# Patient Record
Sex: Female | Born: 1980 | Hispanic: Yes | Marital: Single | Smoking: Never smoker
Health system: Southern US, Community
[De-identification: ages and names within clinical notes are randomized; demographics above are authoritative.]

## PROBLEM LIST (undated history)

## (undated) DIAGNOSIS — N200 Calculus of kidney: Secondary | ICD-10-CM

## (undated) DIAGNOSIS — I10 Essential (primary) hypertension: Secondary | ICD-10-CM

## (undated) DIAGNOSIS — Z87442 Personal history of urinary calculi: Secondary | ICD-10-CM

## (undated) HISTORY — DX: Essential (primary) hypertension: I10

## (undated) HISTORY — DX: Calculus of kidney: N20.0

---

## 2000-04-08 HISTORY — PX: KIDNEY SURGERY: SHX687

## 2003-04-09 HISTORY — PX: BREAST SURGERY: SHX581

## 2003-04-09 HISTORY — PX: AUGMENTATION MAMMAPLASTY: SUR837

## 2005-03-03 ENCOUNTER — Emergency Department: Payer: Self-pay | Admitting: Unknown Physician Specialty

## 2009-01-28 ENCOUNTER — Emergency Department: Payer: Self-pay | Admitting: Internal Medicine

## 2010-05-02 ENCOUNTER — Ambulatory Visit: Payer: Self-pay | Admitting: Family Medicine

## 2012-01-22 ENCOUNTER — Ambulatory Visit: Payer: Self-pay

## 2012-01-23 ENCOUNTER — Ambulatory Visit: Payer: Self-pay

## 2012-02-07 HISTORY — PX: BREAST SURGERY: SHX581

## 2012-04-08 HISTORY — PX: BREAST CYST ASPIRATION: SHX578

## 2012-09-07 ENCOUNTER — Ambulatory Visit: Payer: Self-pay | Admitting: General Surgery

## 2012-09-29 HISTORY — PX: BREAST SURGERY: SHX581

## 2012-09-29 HISTORY — PX: BREAST BIOPSY: SHX20

## 2012-10-01 ENCOUNTER — Encounter: Payer: Self-pay | Admitting: General Surgery

## 2012-10-01 ENCOUNTER — Ambulatory Visit: Payer: Self-pay

## 2012-10-01 ENCOUNTER — Ambulatory Visit (INDEPENDENT_AMBULATORY_CARE_PROVIDER_SITE_OTHER): Payer: PRIVATE HEALTH INSURANCE | Admitting: General Surgery

## 2012-10-01 VITALS — BP 120/72 | HR 74 | Resp 12 | Ht <= 58 in | Wt 126.0 lb

## 2012-10-01 DIAGNOSIS — N63 Unspecified lump in unspecified breast: Secondary | ICD-10-CM

## 2012-10-01 DIAGNOSIS — N6002 Solitary cyst of left breast: Secondary | ICD-10-CM

## 2012-10-01 DIAGNOSIS — N6009 Solitary cyst of unspecified breast: Secondary | ICD-10-CM

## 2012-10-01 NOTE — Patient Instructions (Addendum)
.  asaBiopsia de mama - Cuidados posteriores  (Breast Biopsy, Care After)  Estas indicaciones le proporcionan informacin general acerca de cmo deber cuidarse despus del procedimiento. El mdico tambin podr darle instrucciones especficas. Comunquese con el mdico si tiene algn problema o tiene preguntas despus del procedimiento. CUIDADOS EN EL HOGAR   Slo tome los medicamentos segn le indique el mdico.  No tome aspirina.  Mantenga las suturas (puntos) secos cuando se bae.  Proteja la zona de la biopsia. No deje que la zona se inflame.  Evite las actividades que podran tironear y abrir el sitio de la biopsia hasta que su mdico la autorice. Aqu se incluye:  Elongar.  Estirarse.  La prctica de ejercicios.  Deportes.  Levantar objetos que pesen ms de 3 lb. (1,300 kg.)  Siga su dieta habitual.  Use un buen sostn de soporte durante el tiempo que le indique su mdico.  Cambie los apsitosvendajes) tal como se le indic.  No beba alcohol si toma analgsicos.  Cumpla con los controles mdicos segn las indicaciones. Consulte la fecha en que los resultados estarn disponibles. Asegrese de [employer]. SOLICITE AYUDA DE INMEDIATO SI:   Tiene fiebre.  Aumenta el sangrado (ms de una pequea mancha) en el lugar de la biopsia.  Tiene dificultad para respirar.  Observa una secrecin de color blanco amarillento (pus) que proviene del sitio de la biopsia.  Presenta enrojecimiento, inflamacin (hinchazn),o aumento del dolor en el sitio de la biopsia.  Siente mal olor en el sitio de la biopsia.  La biopsia se abre despus de que le han retirado los [employer] grapas o la [place].  Tiene una erupcin.  Necesita medicamentos ms fuertes. ASEGRESE DE QUE:   Comprende estas instrucciones.  Controlar su enfermedad.  Solicitar ayuda de inmediato si no mejora o si empeora. Document Released: 09/24/2011 [facility] Patient Information 2014  [place], [place].

## 2012-10-01 NOTE — Progress Notes (Signed)
Patient ID: Elby Showers, female   DOB: [birth year], 32 y.o.   MRN: [id number]  Chief Complaint  Patient presents with  . Breast Problem    HPI [facility] Lynann Bologna is a 32 y.o. female.  Who presents for a breast evaluation referred by [facility]. The most recent mammogram was done on 01-23-12 .  Patient does perform regular self breast checks. No family history of breast cancer. She was here in Nov 2013 for similar issue.  Pain in left breast prior to her periods. Denies any other breast issues.  When she was here in Nov 2013 she states the area in the left breast was aspirated and her pain went away. The pain in left breast returned about 3 months ago. The pain is not as severe as it had been prior to her original assessment. 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 Jacgui from [facility] patient services attending the patient for interpretation.   HPI  History reviewed. No pertinent past medical history.  Past Surgical History  Procedure Laterality Date  . Breast surgery Left Nov 2013    aspiration  . Kidney surgery Left 2002    stone  . Breast surgery Left 2005    implant in [place]    No family history on file.  Social History History  Substance Use Topics  . Smoking status: Never Smoker   . Smokeless tobacco: Not on file  . Alcohol Use: No    No Known Allergies  No current outpatient prescriptions on file.   No current facility-administered medications for this visit.    Review of Systems Review of Systems  Blood pressure 120/72, pulse 74, resp. rate 12, height 4\' 9"  (1.448 m), weight 126 lb (57.153 kg), last menstrual period 09/26/2012.  Physical Exam Physical Exam  Constitutional: She is oriented to person, place, and time. She appears well-developed and well-nourished.  Cardiovascular: Normal rate and regular rhythm.   Pulmonary/Chest: Effort normal and breath sounds normal. Right breast exhibits no  inverted nipple, no mass, no nipple discharge, no skin change and no tenderness. Left breast exhibits tenderness. Left breast exhibits no inverted nipple, no mass, no nipple discharge and no skin change.  Lymphadenopathy:    She has no cervical adenopathy.    She has no axillary adenopathy.  Neurological: She is alert and oriented to person, place, and time.  Skin: Skin is warm and dry.  Tenderness left breast axillary tail, slight fullness. Implant in left breast present an unremarkable.   Data Reviewed At the time of her November 2013 exam a multilobulated cyst measuring up to 1.3 cm in diameter was identified in the 1:00 position 7 cm from the nipple.   Ultrasound exam completed today at the 1:00 position, 8 cm from the nipple a multilobulated cystic lesion measuring under 1 cm maximum diameter in all areas was identified. As she had developed recurrent discomfort with cyst recurrence, she was offered the opportunity for either 1) observation versus 2) simple cyst aspiration versus 3) back and biopsy to attempt removal the cyst wall. After consideration, she chose option #3.  Assessment    Recurrent breast cyst, left breast     Plan    The patient underwent a vacuum-assisted biopsy of the left breast. Total 10 cc of 0.5% Xylocaine with 0.25% Marcaine with 1: 200,000 units of epinephrine was utilized a well-tolerated. The area was prepped with ChloraPrep and draped. A 14-gauge Finesse device was passed through the  area under ultrasound guidance. A proximally 8 samples were obtained from multiple locations to completely obliterate the area. The procedure was well-tolerated. A postbiopsy clip was placed. Skin defect was closed with benzoin and Steri-Strips followed by Telfa and Tegaderm dressing. Instructions were provided. The patient will be contacted through the [facility] patient services office when results are available. The patient is aware because of the late hour, this will likely be on October 05, 2012.       Earline Mayotte 10/01/2012, 8:45 PM

## 2012-10-05 ENCOUNTER — Telehealth: Payer: Self-pay | Admitting: *Deleted

## 2012-10-05 LAB — PATHOLOGY

## 2012-10-05 NOTE — Telephone Encounter (Addendum)
The patient was noticed of their results and will follow up as schedule.

## 2012-10-05 NOTE — Telephone Encounter (Signed)
Message copied by Levada Schilling on Mon Oct 05, 2012 12:58 PM ------      Message from: [place], [place]      Created: Mon Oct 05, 2012 12:36 PM       Contact patient via interpreter service to notify her breast biopsy completed last Thursday was fine. No cancer. Nurse follow up as scheduled.  ------

## 2012-10-08 ENCOUNTER — Ambulatory Visit (INDEPENDENT_AMBULATORY_CARE_PROVIDER_SITE_OTHER): Payer: PRIVATE HEALTH INSURANCE | Admitting: *Deleted

## 2012-10-08 DIAGNOSIS — N63 Unspecified lump in unspecified breast: Secondary | ICD-10-CM

## 2012-10-08 NOTE — Progress Notes (Signed)
Patient here today for follow up post left  breast biopsy. Minimal bruising noted.  The patient is aware that a heating pad may be used for comfort as needed.  Aware of pathology. Follow up as scheduled. 

## 2014-02-07 ENCOUNTER — Encounter: Payer: Self-pay | Admitting: General Surgery

## 2014-02-28 ENCOUNTER — Ambulatory Visit: Payer: Self-pay

## 2014-03-10 ENCOUNTER — Ambulatory Visit (INDEPENDENT_AMBULATORY_CARE_PROVIDER_SITE_OTHER): Payer: PRIVATE HEALTH INSURANCE | Admitting: General Surgery

## 2014-03-10 ENCOUNTER — Encounter: Payer: Self-pay | Admitting: General Surgery

## 2014-03-10 VITALS — BP 120/82 | HR 76 | Resp 12 | Ht 61.0 in | Wt 117.0 lb

## 2014-03-10 DIAGNOSIS — N63 Unspecified lump in unspecified breast: Secondary | ICD-10-CM

## 2014-03-10 DIAGNOSIS — N644 Mastodynia: Secondary | ICD-10-CM

## 2014-03-10 NOTE — Progress Notes (Signed)
Patient ID: Wendy Ellis, female   DOB: January 01, 1981, 33 y.o.   MRN: [id number]  Chief Complaint  Patient presents with  . Other    left breast mass    HPI Wendy Ellis is a 33 y.o. female who presents for a breast evaluation. The most recent mammogram was done on 02/28/14 and a left breast ultrasound done on 03/01/14. Patient does perform self breast checks approximately every 2 months. She noticed left breast pain and mass approximately 1 month ago. She states the mass has stayed the same size but is painful. The pain is constant. Sometime the pain is more intense than other times. The mass is described as the size of a fingernail. This is not varied drain her recent menstrual cycle.  Previous biopsy of a recurring cyst in the breast was benign.   HPI  No past medical history on file.  Past Surgical History  Procedure Laterality Date  . Kidney surgery Left 2002    stone  . Breast surgery Left Nov 2013    aspiration  . Breast surgery Left 2005    implant in [place]  . Breast surgery Left 09/29/2012    Vacuum biopsy of the recurrent left breast lesion showing an apical cyst without malignancy.    No family history on file.  Social History History  Substance Use Topics  . Smoking status: Never Smoker   . Smokeless tobacco: Not on file  . Alcohol Use: No    No Known Allergies  No current outpatient prescriptions on file.   No current facility-administered medications for this visit.    Review of Systems Review of Systems  Constitutional: Negative.   Respiratory: Negative.   Cardiovascular: Negative.     Blood pressure 120/82, pulse 76, resp. rate 12, height 5\' 1"  (1.549 m), weight 117 lb (53.071 kg), last menstrual period 02/15/2014.  Physical Exam Physical Exam  Constitutional: She is oriented to person, place, and time. She appears well-developed and well-nourished.  Neck: Neck supple. No thyromegaly present.  Cardiovascular: Normal rate,  regular rhythm and normal heart sounds.   No murmur heard. Pulmonary/Chest: Effort normal and breath sounds normal. Right breast exhibits no inverted nipple, no mass, no nipple discharge, no skin change and no tenderness. Left breast exhibits mass and tenderness. Left breast exhibits no inverted nipple, no nipple discharge and no skin change.    Well healed scar at the bottom of left breast from implant.   1:30 axillary tail thickening in left breast.   Lymphadenopathy:    She has no cervical adenopathy.  Neurological: She is alert and oriented to person, place, and time.  Skin: Skin is warm and dry.    Data Reviewed Left breast mammogram and ultrasound dated 02/28/2014 showed no areas of concern for malignancy.  Retroglandular silicon implant noted. 6 mm oval nodules were noted to be cystic on ultrasound. ((Complicated).  Assessment    Benign breast exam. Focal mastalgia in the left breast axillary tail.    Plan    The patient was reassured that there is no indication for operative intervention at this time. No palpable masses clearly identified on clinical exam, mammography or ultrasound and x-ray tail. The small 6 mm cyst in the breasts are asymptomatic.  The patient should continue monthly self examination. She was encouraged to report any other changes in her breast for reassessment.    PCP:  Wendy Ellis  Ref MD: Wendy Ellis Interpreter: Wendy Ellis, Wendy Ellis 03/11/2014, 7:51 PM

## 2014-03-10 NOTE — Patient Instructions (Signed)
Patient may take tylenol, advil, or aleve as needed for pain or discomfort. Patient to return as needed. The patient is aware to call back for any questions or concerns.

## 2014-03-11 ENCOUNTER — Encounter: Payer: Self-pay | Admitting: General Surgery

## 2014-03-11 DIAGNOSIS — N644 Mastodynia: Secondary | ICD-10-CM | POA: Insufficient documentation

## 2014-07-04 ENCOUNTER — Ambulatory Visit (INDEPENDENT_AMBULATORY_CARE_PROVIDER_SITE_OTHER): Payer: PRIVATE HEALTH INSURANCE | Admitting: General Surgery

## 2014-07-04 ENCOUNTER — Other Ambulatory Visit: Payer: PRIVATE HEALTH INSURANCE

## 2014-07-04 ENCOUNTER — Encounter: Payer: Self-pay | Admitting: General Surgery

## 2014-07-04 VITALS — BP 120/70 | HR 78 | Resp 12 | Ht 61.0 in | Wt 118.0 lb

## 2014-07-04 DIAGNOSIS — N63 Unspecified lump in unspecified breast: Secondary | ICD-10-CM

## 2014-07-04 DIAGNOSIS — N6002 Solitary cyst of left breast: Secondary | ICD-10-CM | POA: Diagnosis not present

## 2014-07-04 NOTE — Progress Notes (Signed)
Patient ID: [name], female   DO: Apr 18, 1980, 34 y.o.   MRM: [id number]  Chief Complaint  Patient presents with  . Other    left breast pain/mass    HPI [facility] [place] is a 34 y.o. female here today for a evaluation of a left breast pain and warmth. She states that she first noticed this about a month ago. She states that the pain and warmth comes and goes. She states that she does feel a small ball in her left breast that has not changed in size. She reports no discharge from the breast. Her last mammogram was done on 02/28/14.   The patient has not noticed a change in the breast discomfort with activity. She was out of work for almost a month with a recent kidney stone treated by lithotripsy at [facility], and during that time she really didn't appreciate much change in the modest upper-outer quadrant discomfort she had reported at the time of her December visit. HPI  Past Medical History  Diagnosis Date  . Kidney stones     Past Surgical History  Procedure Laterality Date  . Kidney surgery Left 2002    stone  . Breast surgery Left Nov 2013    aspiration  . Breast surgery Left 2005    implant in [place]  . 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    No family history on file.  Social History History  Substance Use Topics  . Smoking status: Never Smoker   . Smokeless tobacco: Not on file  . Alcohol Use: No    No Known Allergies  No current outpatient prescriptions on file.   No current facility-administered medications for this visit.    Review of Systems Review of Systems  Constitutional: Negative.   Respiratory: Negative.   Cardiovascular: Negative.     Blood pressure 120/70, pulse 78, resp. rate 12, height 5\' 1"  (1.549 m), weight 118 lb (53.524 kg).  Physical Exam Physical Exam  Constitutional: She is oriented to person, place, and time. She appears  well-developed and well-nourished.  Eyes: Conjunctivae are normal. No scleral icterus.  Neck: Neck supple.  Cardiovascular: Normal rate, regular rhythm and normal heart sounds.   Pulmonary/Chest: Effort normal and breath sounds normal. Right breast exhibits no inverted nipple, no mass, no nipple discharge, no skin change and no tenderness. Left breast exhibits tenderness (at 2 and 8 o'clock). Left breast exhibits no inverted nipple, no mass, no nipple discharge and no skin change.    Abdominal: There is no tenderness.  Lymphadenopathy:    She has no cervical adenopathy.    She has no axillary adenopathy.  Neurological: She is alert and oriented to person, place, and time.  Skin: Skin is warm and dry.    Data Reviewed Ultrasound examination of the upper outer quadrant of the left breast was completed due to the patient's reported persistent pain and also report of her recently appreciated nodular area.  At the 1:30 o'clock position 5 cm from the nipple a simple cyst measuring 0.56 x 0.66 x 0.72 cm is identified. Strong acoustic enhancement is noted. This is outside the area of the patient's reported tenderness. At the 1:30 o'clock position 8 cm in nipple at the area where she recently noted some nodularity and appears to be the focus of her breast discomfort a 0.5 x 0.4 x 0.75 cm cyst was noted. BIRAD-2.  As this  area has been symptomatic and was elected to undergo aspiration. This was completed using 1 mL of 1% plain Xylocaine. The area resolved completely with return of a small amount of clear yellow fluid which was discarded. The procedure was well tolerated.  Assessment    Mild mastalgia, minimal change over the past 3-4 months.  Multiple breast cysts.     Plan    The patient will contact the office if she appreciates any new areas of nodularity. The mild breast discomfort should resolve over time. Hopefully with aspiration of the cyst she'll be more comfortable.  She'll contact  the office if she has any concerns.       Robert Bellow 07/04/2014, 8:47 PM

## 2014-07-04 NOTE — Patient Instructions (Signed)
Continue self breast exams. Call office for any new breast issues or concerns. 

## 2014-07-22 ENCOUNTER — Other Ambulatory Visit: Payer: Self-pay | Admitting: Oncology

## 2014-07-22 DIAGNOSIS — N63 Unspecified lump in unspecified breast: Secondary | ICD-10-CM

## 2014-08-30 ENCOUNTER — Other Ambulatory Visit: Payer: Self-pay

## 2015-05-01 ENCOUNTER — Ambulatory Visit
Admission: RE | Admit: 2015-05-01 | Discharge: 2015-05-01 | Disposition: A | Discharge status: Home / Self Care | Payer: Self-pay | Source: Ambulatory Visit | Attending: Oncology | Admitting: Oncology

## 2015-05-01 ENCOUNTER — Ambulatory Visit (INDEPENDENT_AMBULATORY_CARE_PROVIDER_SITE_OTHER): Payer: PRIVATE HEALTH INSURANCE | Admitting: General Surgery

## 2015-05-01 ENCOUNTER — Ambulatory Visit: Payer: Self-pay | Attending: Oncology

## 2015-05-01 ENCOUNTER — Other Ambulatory Visit: Payer: PRIVATE HEALTH INSURANCE

## 2015-05-01 ENCOUNTER — Encounter: Payer: Self-pay | Admitting: General Surgery

## 2015-05-01 VITALS — BP 126/68 | HR 68 | Resp 14 | Ht 62.0 in | Wt 122.0 lb

## 2015-05-01 VITALS — BP 121/82 | HR 70 | Temp 98.3°F | Ht 62.21 in | Wt 120.0 lb

## 2015-05-01 DIAGNOSIS — N63 Unspecified lump in unspecified breast: Secondary | ICD-10-CM

## 2015-05-01 DIAGNOSIS — N6320 Unspecified lump in the left breast, unspecified quadrant: Secondary | ICD-10-CM

## 2015-05-01 DIAGNOSIS — N644 Mastodynia: Secondary | ICD-10-CM | POA: Diagnosis not present

## 2015-05-01 DIAGNOSIS — N6002 Solitary cyst of left breast: Secondary | ICD-10-CM | POA: Insufficient documentation

## 2015-05-01 NOTE — Progress Notes (Signed)
Patient ID: Wendy Ellis, female   DOB: Jun 02, 1980, 35 y.o.   MRN: [id number]  Chief Complaint  Patient presents with  . Other    Breast pain    HPI Wendy Ellis is a 35 y.o. female here today for an evaluation of left breast pain. The pain started about one month ago December 2016, patient states she felt a small lump. She had a mammogram and ultrasound done today 05/01/15. Denies any injury to her breasts.  The patient had previously had benefit from aspiration completed in March 2016.  She has been drinking a moderate amount of caffeine-containing beverages.  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  I personally reviewed the patient's history. HPI  Past Medical History  Diagnosis Date  . Kidney stones     Past Surgical History  Procedure Laterality Date  . Kidney surgery Left 2002    stone  . Breast surgery Left Nov 2013    aspiration  . Breast surgery Left 2005    implant in [place]  . 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  . Breast cyst aspiration Left 2014  . Augmentation mammaplasty Left 2005    No family history on file.  Social History Social History  Substance Use Topics  . Smoking status: Never Smoker   . Smokeless tobacco: None  . Alcohol Use: No    No Known Allergies  No current outpatient prescriptions on file.   No current facility-administered medications for this visit.    Review of Systems Review of Systems  Constitutional: Negative.   Respiratory: Negative.   Cardiovascular: Negative.     Blood pressure 126/68, pulse 68, resp. rate 14, height 5\' 2"  (1.575 m), weight 122 lb (55.339 kg), last menstrual period 04/05/2015.  Physical Exam Physical Exam  Constitutional: She is oriented to person, place, and time. She appears well-developed and well-nourished.   Eyes: Conjunctivae are normal. No scleral icterus.  Neck: Neck supple.  Cardiovascular: Normal rate, regular rhythm and normal heart sounds.   Pulmonary/Chest: Effort normal and breath sounds normal. Right breast exhibits no inverted nipple, no mass, no nipple discharge, no skin change and no tenderness. Left breast exhibits no inverted nipple, no mass, no nipple discharge, no skin change and no tenderness. Breasts are asymmetrical (Left breast about two cup sizes larger).    Lymphadenopathy:    She has no cervical adenopathy.    She has no axillary adenopathy.  Neurological: She is alert and oriented to person, place, and time.  Skin: Skin is warm and dry.    Data Reviewed Ultrasound examination of the left breast showed 2 primary areas in the upper outer quadrant where she is most sensitive. At the 1:30 o'clock position, 7 cm from the nipple a 0.35 x 0.41 x 0.4-7 m bilobed cystic lesion is identified. At the 2:00 position, 57 m from the nipple a 0.7 x 0.77 x 0.9 cm anechoic softly lobulated area with excellent posterior acoustic enhancement consistent with a cyst was identified. BI-RADS-2. 3  Both areas were tender with compression by the ultrasound probe. As she had benefited from aspiration in the past this was offered and accepted. 1 mL of 1% plain Xylocaine was utilized. Both lesions were aspirin with complete resolution. No samples were sent for cytology. The procedure was well tolerated.  Assessment    Mastalgia, possibly aggravated  by caffeine use.    Plan    Ocuvite/Protegra twice a day for 3 months with decreased caffeine consumption has been encouraged.      The patient has moderate asymmetry, and she was encouraged to be sure her bras fit as well as possible to minimize undue compression in the upper outer quadrant on the more prominent (symptomatic) left side. Encouraged to limit caffeine intake and take an antinflammatory vitamin to help with the sensitivity.    This  information has been scribed by Verlene Mayer, CMA   PCP: Torrie Mayers Ref: [facility]  Robert Bellow 05/02/2015, 4:53 PM

## 2015-05-01 NOTE — Patient Instructions (Signed)
Encouraged to limit caffeine intake and take an antinflammatory vitamin to help with the sensitivity.

## 2015-05-01 NOTE — Progress Notes (Signed)
Subjective:     Patient ID: Wendy Ellis, female   DOB: Apr 27, 1980, 35 y.o.   MRN: [id number]  HPI   Review of Systems     Objective:   Physical Exam  Pulmonary/Chest: Right breast exhibits no inverted nipple, no mass, no nipple discharge, no skin change and no tenderness. Left breast exhibits no inverted nipple, no mass, no nipple discharge, no skin change and no tenderness. Breasts are symmetrical.    Left breast Implant; Benign left breast biopsy       Assessment:     35 year old hispanic patient, fluent in [place] presents for [facility] follow-up.  Left breast implant for cosmetic purposes.  Patient had benign left breast biopsy in 2014 with Dr. Bary Castilla.  CBE unremarkable.  No mass or lump palpated.  Last mammogram in November 2015 was Birads 3 showing a cluster of cysts at 1:30. She was also followed by Dr. Bary Castilla for this.  Today she complains of targeted pain in the same area.     Plan:     Sent for bilateral diagnotic mammogram, and bilateral ultrasound.

## 2015-05-03 NOTE — Progress Notes (Signed)
Letter mailed from [facility] to notify of normal mammogram results.  Patient to return in one year for annual screening.  Copy to [facility]. 

## 2017-05-07 ENCOUNTER — Other Ambulatory Visit: Payer: Self-pay | Admitting: Primary Care

## 2017-05-07 DIAGNOSIS — R2232 Localized swelling, mass and lump, left upper limb: Secondary | ICD-10-CM

## 2017-05-12 ENCOUNTER — Ambulatory Visit
Admission: RE | Admit: 2017-05-12 | Discharge: 2017-05-12 | Disposition: A | Discharge status: Home / Self Care | Payer: BLUE CROSS/BLUE SHIELD | Source: Ambulatory Visit | Attending: Primary Care | Admitting: Primary Care

## 2017-05-12 DIAGNOSIS — M7989 Other specified soft tissue disorders: Secondary | ICD-10-CM | POA: Diagnosis present

## 2017-05-12 DIAGNOSIS — R2232 Localized swelling, mass and lump, left upper limb: Secondary | ICD-10-CM | POA: Diagnosis not present

## 2017-08-21 ENCOUNTER — Other Ambulatory Visit: Payer: Self-pay | Admitting: Primary Care

## 2017-08-21 DIAGNOSIS — N6321 Unspecified lump in the left breast, upper outer quadrant: Secondary | ICD-10-CM

## 2017-09-02 ENCOUNTER — Other Ambulatory Visit: Payer: BLUE CROSS/BLUE SHIELD

## 2017-09-09 ENCOUNTER — Ambulatory Visit
Admission: RE | Admit: 2017-09-09 | Discharge: 2017-09-09 | Disposition: A | Discharge status: Home / Self Care | Payer: BLUE CROSS/BLUE SHIELD | Source: Ambulatory Visit | Attending: Primary Care | Admitting: Primary Care

## 2017-09-09 DIAGNOSIS — N6321 Unspecified lump in the left breast, upper outer quadrant: Secondary | ICD-10-CM

## 2017-12-24 ENCOUNTER — Other Ambulatory Visit: Payer: Self-pay

## 2017-12-24 ENCOUNTER — Emergency Department
Admission: EM | Admit: 2017-12-24 | Discharge: 2017-12-24 | Disposition: A | Discharge status: Home / Self Care | Payer: BLUE CROSS/BLUE SHIELD | Attending: Emergency Medicine | Admitting: Emergency Medicine

## 2017-12-24 ENCOUNTER — Emergency Department: Payer: BLUE CROSS/BLUE SHIELD

## 2017-12-24 DIAGNOSIS — Y999 Unspecified external cause status: Secondary | ICD-10-CM | POA: Insufficient documentation

## 2017-12-24 DIAGNOSIS — Y92410 Unspecified street and highway as the place of occurrence of the external cause: Secondary | ICD-10-CM | POA: Insufficient documentation

## 2017-12-24 DIAGNOSIS — Y9389 Activity, other specified: Secondary | ICD-10-CM | POA: Insufficient documentation

## 2017-12-24 DIAGNOSIS — M542 Cervicalgia: Secondary | ICD-10-CM | POA: Diagnosis not present

## 2017-12-24 DIAGNOSIS — S3992XA Unspecified injury of lower back, initial encounter: Secondary | ICD-10-CM | POA: Diagnosis present

## 2017-12-24 DIAGNOSIS — S39012A Strain of muscle, fascia and tendon of lower back, initial encounter: Secondary | ICD-10-CM

## 2017-12-24 LAB — POCT PREGNANCY, URINE: Preg Test, Ur: NEGATIVE

## 2017-12-24 MED ORDER — ACETAMINOPHEN 500 MG PO TABS
1000.0000 mg | ORAL_TABLET | Freq: Once | ORAL | Status: AC
  Administered 2017-12-24: 1000 mg via ORAL
  Filled 2017-12-24: qty 2

## 2017-12-24 MED ORDER — CYCLOBENZAPRINE HCL 5 MG PO TABS: 5.0000 mg | ORAL_TABLET | Freq: Three times a day (TID) | ORAL | 0 refills | Status: DC | PRN
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
## 2017-12-24 NOTE — Discharge Instructions (Signed)
Your exam is normal and your x-rays are negative. Take OTC Tylenol or Motrin along with the muscle relaxant, for pain relief. Follow-up with your provider for ongoing symptoms.

## 2017-12-24 NOTE — ED Notes (Signed)
Pregnancy test negative
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
## 2018-04-21 ENCOUNTER — Other Ambulatory Visit: Payer: Self-pay | Admitting: Primary Care

## 2018-04-21 DIAGNOSIS — N3000 Acute cystitis without hematuria: Secondary | ICD-10-CM

## 2018-04-29 ENCOUNTER — Ambulatory Visit: Admission: RE | Admit: 2018-04-29 | Payer: BLUE CROSS/BLUE SHIELD | Source: Ambulatory Visit

## 2018-05-19 ENCOUNTER — Other Ambulatory Visit: Payer: Self-pay

## 2018-05-19 ENCOUNTER — Encounter: Payer: Self-pay | Admitting: General Surgery

## 2018-05-19 ENCOUNTER — Ambulatory Visit (INDEPENDENT_AMBULATORY_CARE_PROVIDER_SITE_OTHER): Payer: PRIVATE HEALTH INSURANCE | Admitting: General Surgery

## 2018-05-19 ENCOUNTER — Ambulatory Visit (INDEPENDENT_AMBULATORY_CARE_PROVIDER_SITE_OTHER): Payer: BLUE CROSS/BLUE SHIELD

## 2018-05-19 VITALS — BP 126/87 | HR 69 | Temp 97.7°F | Resp 16 | Ht <= 58 in | Wt 131.4 lb

## 2018-05-19 DIAGNOSIS — N6321 Unspecified lump in the left breast, upper outer quadrant: Secondary | ICD-10-CM

## 2018-05-19 DIAGNOSIS — D1722 Benign lipomatous neoplasm of skin and subcutaneous tissue of left arm: Secondary | ICD-10-CM

## 2018-05-19 DIAGNOSIS — N644 Mastodynia: Secondary | ICD-10-CM

## 2018-05-19 MED ORDER — DOXYCYCLINE HYCLATE 100 MG PO TABS
100.0000 mg | ORAL_TABLET | Freq: Two times a day (BID) | ORAL | 0 refills | Status: AC
Start: 2018-05-19 — End: 2018-06-18

## 2018-05-19 NOTE — Patient Instructions (Signed)
Rx sent [facility] and take a probiotic daily . Return in one month.The patient is aware to call back for any questions or concerns.

## 2018-05-19 NOTE — Progress Notes (Signed)
Patient ID: Wendy Ellis, female   DOB: 08-Jan-1981, 37 y.o.   MRN: [id number]  Chief Complaint  Patient presents with  . Follow-up    Left Breast cyst    HPI Wendy Ellis Wendy Ellis is a 38 y.o. female.  Here today for left breast cyst.  Painful. No nipple drainage. The area become warm and tender. Denies fever or chills.  HPI  Past Medical History:  Diagnosis Date  . Kidney stones     Past Surgical History:  Procedure Laterality Date  . AUGMENTATION MAMMAPLASTY Left 2005   left breast never devleoped   . BREAST CYST ASPIRATION Left 2014  . BREAST SURGERY Left Nov 2013   aspiration  . BREAST SURGERY Left 2005   implant in [place]  . 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  Marland Kitchen KIDNEY SURGERY Left 2002   stone    History reviewed. No pertinent family history.  Social History Social History   Tobacco Use  . Smoking status: Never Smoker  . Smokeless tobacco: Never Used  Substance Use Topics  . Alcohol use: No    Alcohol/week: 0.0 standard drinks  . Drug use: No    No Known Allergies  Current Outpatient Medications  Medication Sig Dispense Refill  . doxycycline (VIBRA-TABS) 100 MG tablet Take 1 tablet (100 mg total) by mouth 2 (two) times daily for 30 days. 60 tablet 0   No current facility-administered medications for this visit.     Review of Systems Review of Systems  Constitutional: Negative.   Respiratory: Negative.   Cardiovascular: Negative.     Blood pressure 126/87, pulse 69, temperature 97.7 F (36.5 C), temperature source Temporal, resp. rate 16, height 4\' 9"  (1.448 m), weight 131 lb 6.4 oz (59.6 kg), last menstrual period 04/28/2018, SpO2 99 %.  Physical Exam Physical Exam Constitutional:      Appearance: She is well-developed.  Eyes:     General: No scleral icterus.    Conjunctiva/sclera: Conjunctivae normal.  Neck:     Musculoskeletal: Normal  range of motion.  Cardiovascular:     Rate and Rhythm: Normal rate and regular rhythm.     Heart sounds: Normal heart sounds.  Pulmonary:     Effort: Pulmonary effort is normal.     Breath sounds: Normal breath sounds.  Chest:     Breasts:        Right: No inverted nipple, mass, nipple discharge, skin change or tenderness.        Left: Tenderness present. No inverted nipple, mass, nipple discharge or skin change.     Comments: Tender at 3 o'clock left breast  Musculoskeletal:       Arms:  Lymphadenopathy:     Cervical: No cervical adenopathy.     Upper Body:     Right upper body: No supraclavicular or axillary adenopathy.     Left upper body: No supraclavicular or axillary adenopathy.  Skin:    General: Skin is warm and dry.     Comments:  5 by 7 cm posterior left upper arm lipoma   Neurological:     Mental Status: She is alert and oriented to person, place, and time.     Data Reviewed October 01, 2012 vacuum biopsy of the 1 o'clock position of the left breast: Diagnosis: LEFT BREAST 1:00: - COLLAPSED APOCRINE CYST AND STROMAL FIBROSIS. - NEGATIVE FOR ATYPIA AND MALIGNANCY.  September 09, 2017 bilateral mammograms  were reviewed.  Dense fibroglandular tissue noted in the left upper outer quadrant.  Ultrasound showed normal-appearing dense fibroglandular tissue.  Numerous small cysts.  BI-RADS-1.  Ultrasound examination of the area where the patient reports warmth during days long episodes of breast tenderness was completed.  The implant appears to have a normal echo pattern.  In the 3 o'clock position the left breast 10 cm from the nipple, lateral to the implant, there is a hypoechoic area adjacent to the underlying pectoralis fascia measuring 0.92 x 1.27 x 1.71 cm.  Just superior to this is a small 0.44 cm simple cyst.  The dominant lesion abuts the pectoralis fashion is not clear whether posterior shadowing is noted.  The breast parenchyma here is very dense and multiple small adjacent  cysts are noted.  On today's exam there is not significant tenderness with firm pressure of the ultrasound probe.  No architectural distortion is appreciated.  BI-RADS-3.  Ultrasound examination of the left upper extremity dated May 12, 2017 described a 6 cm subcutaneous lesion thought to represent a subcutaneous lipoma.  Assessment    Episodic breast pain with increasing frequency and duration.    Lipoma left posterior superior upper arm, asymptomatic.  Plan  The patient's clinical exam does not show a clear reason for her present symptoms.  She drinks about 1 cup of coffee a day, and this has not changed significantly since her last exam here several years ago.  The breast is unremarkable to visual inspection and there is no evidence of erythema.  There is no warmth or tenderness of note on today's exam.  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  It is now somewhat larger than the right breast since she has had her children and nursed successfully.  She is considering having symmetry surgery for the right breast equal of the left.  At this time I see no contraindication  The explanation for the episodic pain and the hypoechoic area in the lateral aspect of the left breast is unclear.  Having had 2 Hispanic women with atypical infections presenting with pain, I have recommended a trial of doxycycline 100 mg p.o. twice daily for 30 days.  We will reassess this area at that time.  If she continues to have her frequent episodes of tenderness and warmth, would consider excision of this area rather than vacuum biopsy due to its proximity to the implant.  HPI, Physical Exam, Assessment and Plan have been scribed under the direction and in the presence of Robert Bellow, MD. [name], CMA   I have completed the exam and reviewed the above documentation for accuracy and completeness.  I agree with the above.  Development worker, community has been used and any errors in dictation or transcription are unintentional.  Hervey Ard, M.D., F.A.C.S.  [name] [name] 05/19/2018, 9:13 PM

## 2018-06-16 ENCOUNTER — Other Ambulatory Visit: Payer: Self-pay

## 2018-06-16 ENCOUNTER — Encounter: Payer: Self-pay | Admitting: General Surgery

## 2018-06-16 ENCOUNTER — Ambulatory Visit (INDEPENDENT_AMBULATORY_CARE_PROVIDER_SITE_OTHER): Payer: Self-pay | Admitting: General Surgery

## 2018-06-16 VITALS — BP 129/77 | HR 79 | Temp 97.9°F | Resp 12 | Ht <= 58 in | Wt 129.0 lb

## 2018-06-16 DIAGNOSIS — N6321 Unspecified lump in the left breast, upper outer quadrant: Secondary | ICD-10-CM

## 2018-06-16 DIAGNOSIS — D1722 Benign lipomatous neoplasm of skin and subcutaneous tissue of left arm: Secondary | ICD-10-CM

## 2018-06-16 NOTE — Progress Notes (Signed)
Patient ID: Wendy Ellis, female   DOB: 1980/05/06, 38 y.o.   MRN: [id number]  Chief Complaint  Patient presents with  . Follow-up    left breast cyst    HPI [facility] [place] is a 38 y.o. female.  Her for follow up left breast cyst. She states the area seems better. She is still on the antibiotics, she wasn't able to take the morning dose until she realized she needed to take it with food.   HPI  Past Medical History:  Diagnosis Date  . Kidney stones     Past Surgical History:  Procedure Laterality Date  . AUGMENTATION MAMMAPLASTY Left 2005   left breast never devleoped   . BREAST CYST ASPIRATION Left 2014  . BREAST SURGERY Left Nov 2013   aspiration  . BREAST SURGERY Left 2005   implant in [place]  . 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  Marland Kitchen KIDNEY SURGERY Left 2002   stone    No family history on file.  Social History Social History   Tobacco Use  . Smoking status: Never Smoker  . Smokeless tobacco: Never Used  Substance Use Topics  . Alcohol use: No    Alcohol/week: 0.0 standard drinks  . Drug use: No    No Known Allergies  Current Outpatient Medications  Medication Sig Dispense Refill  . doxycycline (VIBRA-TABS) 100 MG tablet Take 1 tablet (100 mg total) by mouth 2 (two) times daily for 30 days. 60 tablet 0   No current facility-administered medications for this visit.     Review of Systems Review of Systems  Constitutional: Negative.   Respiratory: Negative.   Cardiovascular: Negative.     Blood pressure 129/77, pulse 79, temperature 97.9 F (36.6 C), resp. rate 12, height 4\' 9"  (1.448 m), weight 129 lb (58.5 kg), SpO2 99 %.  Physical Exam Physical Exam Exam conducted with a chaperone present.  Constitutional:      Appearance: Normal appearance.  Chest:       Comments: improving Musculoskeletal:       Arms:  Skin:    General: Skin  is warm and dry.     Comments: Left arm lipoma  Neurological:     Mental Status: She is alert and oriented to person, place, and time.  Psychiatric:        Mood and Affect: Mood normal.        Behavior: Behavior normal.     Data Reviewed Review of the May 19, 2018 ultrasound completed in office suggested post lesions versus.  Assessment Resolution of breast pain.  Lipoma left posterior superior upper arm.  Plan  The patient is aware to call back for any questions or new concerns.  She is considering symmetry surgery on the right.  No contraindication.    Follow up as needed, or if she develops recurrent symptoms involving the left breast.  The patient would be a candidate for screening mammograms in June 2020 with [facility] program coordinator.   HPI, assessment, plan and physical exam has been scribed under the direction and in the presence of Robert Bellow, MD. Karie Fetch, RN  I have completed the exam and reviewed the above documentation for accuracy and completeness.  I agree with the above.  Haematologist has been used and any errors in dictation or transcription are unintentional.  Hervey Ard, M.D., F.A.C.S.  [name] Janith Nielson 06/17/2018, 10:19 AM

## 2018-06-16 NOTE — Patient Instructions (Signed)
The patient is aware to call back for any questions or new concerns.  

## 2019-06-17 ENCOUNTER — Other Ambulatory Visit: Payer: Self-pay | Admitting: Primary Care

## 2019-06-17 DIAGNOSIS — Z1231 Encounter for screening mammogram for malignant neoplasm of breast: Secondary | ICD-10-CM

## 2019-07-26 ENCOUNTER — Other Ambulatory Visit: Payer: Self-pay | Admitting: Primary Care

## 2019-07-26 ENCOUNTER — Ambulatory Visit
Admission: RE | Admit: 2019-07-26 | Discharge: 2019-07-26 | Disposition: A | Discharge status: Home / Self Care | Payer: BC Managed Care – PPO | Source: Ambulatory Visit | Attending: Primary Care | Admitting: Primary Care

## 2019-07-26 DIAGNOSIS — Z1231 Encounter for screening mammogram for malignant neoplasm of breast: Secondary | ICD-10-CM | POA: Diagnosis present

## 2019-08-21 ENCOUNTER — Ambulatory Visit: Payer: BC Managed Care – PPO

## 2019-11-29 ENCOUNTER — Other Ambulatory Visit: Payer: Self-pay

## 2019-11-29 ENCOUNTER — Encounter: Payer: Self-pay | Admitting: Surgery

## 2019-11-29 ENCOUNTER — Ambulatory Visit (INDEPENDENT_AMBULATORY_CARE_PROVIDER_SITE_OTHER): Payer: BC Managed Care – PPO | Admitting: Surgery

## 2019-11-29 VITALS — BP 130/78 | HR 85 | Temp 97.7°F | Resp 12 | Ht 60.0 in | Wt 138.6 lb

## 2019-11-29 DIAGNOSIS — N6002 Solitary cyst of left breast: Secondary | ICD-10-CM

## 2019-11-29 NOTE — Patient Instructions (Addendum)
Your Ultrasound is scheduled for 12/07/19 at 3pm at the [facility].   See your appointment below. Call the office if you have any questions or concerns.   Su ultrasonido est programado para el 31/8/21 a las 3 pm en [employer].  Vea su cita a continuacin. Llame a la oficina si tiene alguna pregunta o inquietud.

## 2019-11-29 NOTE — Progress Notes (Signed)
11/29/2019  History of Present Illness: Wendy Ellis is a 39 y.o. female presenting for follow-up of a left breast cyst.  Patient had been seen by Dr. Bary Castilla in the past and overall has had a prior aspiration as well as biopsy.  She is status post left breast implant augmentation in 2005 in [place] for a underdeveloped left breast.  Patient reports that since she was last seen in 06/2018, she feels that the cyst has grown again and has become tender again on the left lateral side of the left breast.  Denies any redness, drainage.  She also been seen for left upper arm/shoulder lipoma patient reports that this is also more tender as well and is uncomfortable when she sleeps on her left side.  Is also wondering if there is any complication with her left breast implant and whether it should be removed or not.  She had a mammogram on 07/27/2019 which did not show any suspicious findings.  Past Medical History: Past Medical History:  Diagnosis Date  . Hypertension   . Kidney stones      Past Surgical History: Past Surgical History:  Procedure Laterality Date  . AUGMENTATION MAMMAPLASTY Left 2005   left breast never devleoped   . BREAST CYST ASPIRATION Left 2014  . BREAST SURGERY Left Nov 2013   aspiration  . BREAST SURGERY Left 2005   implant in [place]  . 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  Marland Kitchen KIDNEY SURGERY Left 2002   stone    Home Medications: Prior to Admission medications   Not on File    Allergies: No Known Allergies  Review of Systems: Review of Systems  Constitutional: Negative for chills and fever.  Respiratory: Negative for shortness of breath.   Cardiovascular: Negative for chest pain.  Gastrointestinal: Negative for abdominal pain, nausea and vomiting.  Skin: Negative for rash.    Physical Exam BP 130/78   Pulse 85   Temp 97.7 F (36.5 C)   Resp 12   Ht 5'  (1.524 m)   Wt 138 lb 9.6 oz (62.9 kg)   SpO2 98%   BMI 27.07 kg/m  CONSTITUTIONAL: No acute distress HEENT:  Normocephalic, atraumatic, extraocular motion intact. RESPIRATORY:  Lungs are clear, and breath sounds are equal bilaterally. Normal respiratory effort without pathologic use of accessory muscles. CARDIOVASCULAR: Heart is regular without murmurs, gallops, or rubs. BREAST: Right breast without any palpable masses, skin changes, nipple changes.  There is no right axillary lymphadenopathy.  Left breast status post augmentation with inferior scar that is well-healed.  The left breast is overall larger compared to the right.  On the left lateral side toward the tail of Spence, there is an area of bogginess which is asymmetric compared to the right breast.  Patient describes the area that is tender to palpation on currently is not tender on exam.  I can feel the lateral edge of the implant in this location.  There is no axillary lymphadenopathy on the left SKIN: Left upper extremity shows 7 x 4 cm mass consistent with a lipoma towards the left shoulder posteriorly.  Somewhat mobile and soft.  Nontender to palpation. NEUROLOGIC:  Motor and sensation is grossly normal.  Cranial nerves are grossly intact. PSYCH:  Alert and oriented to person, place and time. Affect is normal.  Labs/Imaging: Mammogram 07/27/19: FINDINGS: There are no findings suspicious for malignancy. Images were processed with CAD.  IMPRESSION: No mammographic evidence of malignancy. A result letter of this screening mammogram will be mailed directly to the patient.  RECOMMENDATION: Screening mammogram in one year. (Code:SM-B-01Y)  BI-RADS CATEGORY  1:  Negative.   Assessment and Plan: This is a 39 y.o. female with a history of left breast cyst as well as a left upper arm/shoulder lipoma.  -Discussed with the patient that her mammogram is overall negative for any suspicious findings.  Potentially she could be  having complication from her implant although is less likely.  Potentially she may be having a recurrence of the cysts which have been aspirated in the past.  I will order an ultrasound of the left breast to evaluate the area of concern and potentially have this aspirated by radiology.  There is no mass or cyst on imaging, then it may be worthwhile to refer her to plastic surgery for evaluation of the implant. -With regards to the left upper arm/shoulder lipoma, is something that we should be able to excise.  Gust with her depending on the results of the ultrasound, she will come back to follow-up with me in about 10 days to discuss results and potentially schedule her for surgery for the arm lipoma at the least. -She is in agreement with this plan and all of her questions have been answered.  Face-to-face time spent with the patient and care providers was 25 minutes, with more than 50% of the time spent counseling, educating, and coordinating care of the patient.     Melvyn Neth, [facility]

## 2019-12-07 ENCOUNTER — Ambulatory Visit
Admission: RE | Admit: 2019-12-07 | Discharge: 2019-12-07 | Disposition: A | Discharge status: Home / Self Care | Payer: BC Managed Care – PPO | Source: Ambulatory Visit | Attending: Surgery | Admitting: Surgery

## 2019-12-07 ENCOUNTER — Other Ambulatory Visit: Payer: Self-pay | Admitting: Surgery

## 2019-12-07 ENCOUNTER — Other Ambulatory Visit: Payer: Self-pay

## 2019-12-07 DIAGNOSIS — N6002 Solitary cyst of left breast: Secondary | ICD-10-CM

## 2019-12-08 ENCOUNTER — Ambulatory Visit: Payer: Self-pay | Admitting: Surgery

## 2019-12-08 ENCOUNTER — Ambulatory Visit (INDEPENDENT_AMBULATORY_CARE_PROVIDER_SITE_OTHER): Payer: BC Managed Care – PPO | Admitting: Surgery

## 2019-12-08 ENCOUNTER — Other Ambulatory Visit: Payer: Self-pay

## 2019-12-08 ENCOUNTER — Encounter: Payer: Self-pay | Admitting: Surgery

## 2019-12-08 VITALS — BP 120/79 | HR 80 | Temp 98.7°F | Resp 16 | Ht 60.0 in | Wt 139.0 lb

## 2019-12-08 DIAGNOSIS — D1722 Benign lipomatous neoplasm of skin and subcutaneous tissue of left arm: Secondary | ICD-10-CM

## 2019-12-08 DIAGNOSIS — N644 Mastodynia: Secondary | ICD-10-CM | POA: Diagnosis not present

## 2019-12-08 NOTE — Patient Instructions (Signed)
A referral to plastic surgeon has been placed and they will contact you with an appointment.  Can try using evening prime rose oil to possibly help with breast pain.   Cut back on caffeine as this can also help with breast pain.  Call when you would like to remove the lipoma on your left upper arm and we will be happy to schedule that for you.  Follow up in October.

## 2019-12-08 NOTE — Progress Notes (Signed)
12/08/2019  History of Present Illness: Wendy Ellis is a 39 y.o. female with a history of left breast pain presents for follow up.  She had an ultrasound and mammogram done on 12/07/19 which only found two benign left breast cysts, 4 mm and 7 mm in size in the left outer breast.  No masses or other suspicious findings.    Today the patient reports still having soreness in the lateral left breast, going towards the axilla.  Denies any worsening.  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  Past Medical History: Past Medical History:  Diagnosis Date  . Hypertension   . Kidney stones      Past Surgical History: Past Surgical History:  Procedure Laterality Date  . AUGMENTATION MAMMAPLASTY Left 2005   left breast never devleoped   . BREAST CYST ASPIRATION Left 2014  . BREAST SURGERY Left Nov 2013   aspiration  . BREAST SURGERY Left 2005   implant in [place]  . 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  Marland Kitchen KIDNEY SURGERY Left 2002   stone    Home Medications: Prior to Admission medications   Not on File    Allergies: No Known Allergies  Review of Systems: Review of Systems  Constitutional: Negative for chills and fever.  Respiratory: Negative for shortness of breath.   Cardiovascular: Negative for chest pain.  Gastrointestinal: Negative for nausea and vomiting.  Skin: Negative for rash.    Physical Exam BP 120/79   Pulse 80   Temp 98.7 F (37.1 C) (Oral)   Resp 16   Ht 5' (1.524 m)   Wt 139 lb (63 kg)   LMP 11/12/2019 Comment: pt states not pregnant, pt shielded  SpO2 98%   BMI 27.15 kg/m  CONSTITUTIONAL: No acute distress HEENT:  Normocephalic, atraumatic, extraocular motion intact. RESPIRATORY:  Lungs are clear, and breath sounds are equal bilaterally.  Normal respiratory effort without pathologic use of accessory muscles. CARDIOVASCULAR: Heart is regular without murmurs, gallops, or rubs. BREAST:  Exam deferred today. MUSCULOSKELETAL:  Patient has a stable but large left posterior upper arm lipoma.  No skin ulceration or changes.  No issues with sensory or motor function of left arm. PSYCH:  Alert and oriented to person, place and time. Affect is normal.  Labs/Imaging: Mammogram and U/S 12/07/19: FINDINGS: There is mammographically stable extremely dense tissue in the upper outer left breast at the location of the mass felt by the patient, marked with a metallic marker. There is also a biopsy marker clip in that region of the breast. No mammographically visible mass or other findings suspicious for malignancy.  Mammographic images were processed with CAD.  On physical exam, the patient described diffuse tenderness throughout the upper-outer left breast. There was no palpable mass today.  Targeted ultrasound is performed, showing a 4 mm cyst containing low-level internal echoes in the 1 o'clock position of the left breast, 7 cm from the nipple, at the location of patient concern. Deeper in that portion of the breast, there was a 6 mm simple cyst. No solid masses were seen.  IMPRESSION: Benign left breast cysts.  No evidence of malignancy.  Assessment and Plan: This is a 39 y.o. female with left breast pain and left upper posterior arm lipoma.  --Discussed with the  patient that the cysts she has are very small and non-complex.  Unclear that this would be the reason for her persistent pain.  Discussed that she could cut down on caffeine intake, use NSAIDs for pain control, and take Evening Primrose Oil for help with breast pain.  However, these changes/medications may not help.  Discussed with her that this could potentially be as a result of her left breast implant.  Will place referral for Plastic Surgery to evaluate her as a  precaution. --She would still like to have the lipoma removed.  Discussed with her there is no urgency with it, and that I would not be available between 9/24 and 10/10.  She currently is taking care of her 20 year old father, so she would like to wait until October to schedule surgery.  Will have her follow up in October to discuss the lipoma excision and check on her left breast pain progress.  Face-to-face time spent with the patient and care providers was 15 minutes, with more than 50% of the time spent counseling, educating, and coordinating care of the patient.     Melvyn Neth, [facility]

## 2020-01-17 ENCOUNTER — Ambulatory Visit: Payer: BC Managed Care – PPO | Admitting: Surgery

## 2020-01-31 ENCOUNTER — Ambulatory Visit: Payer: BC Managed Care – PPO | Admitting: Surgery

## 2020-02-07 ENCOUNTER — Ambulatory Visit: Payer: BC Managed Care – PPO | Admitting: Surgery

## 2020-02-14 ENCOUNTER — Ambulatory Visit (INDEPENDENT_AMBULATORY_CARE_PROVIDER_SITE_OTHER): Payer: BC Managed Care – PPO | Admitting: Surgery

## 2020-02-14 ENCOUNTER — Other Ambulatory Visit: Payer: Self-pay

## 2020-02-14 ENCOUNTER — Encounter: Payer: Self-pay | Admitting: Surgery

## 2020-02-14 VITALS — BP 117/71 | HR 98 | Temp 98.5°F | Ht 60.0 in | Wt 138.0 lb

## 2020-02-14 DIAGNOSIS — D1722 Benign lipomatous neoplasm of skin and subcutaneous tissue of left arm: Secondary | ICD-10-CM

## 2020-02-14 DIAGNOSIS — N644 Mastodynia: Secondary | ICD-10-CM | POA: Diagnosis not present

## 2020-02-14 NOTE — Patient Instructions (Addendum)
We spoke with you about excising the breast cysts as well as sugical removal of your lipoma of the shoulder.  If Dr Marla Roe says that the breast cysts should be excised we would be able to do this at the same time as the lipoma of your shoulder.   If she decides that the breast cysts are not the cause of your discomfort we can look at just scheduling the removal of your lipoma at [facility].  See your [facility] Surgery sheet for information. Our surgery scheduler will be in contact with you to verify surgery date as well as go over information.   We will schedule a virtual phone visit prior to your surgery.

## 2020-02-14 NOTE — Progress Notes (Signed)
02/14/2020  History of Present Illness: Wendy Ellis is a 39 y.o. female patient follow-up of left breast pain and left shoulder lipoma.  Patient was last seen on 9/1 at which time she has not had any ultrasound and mammogram the previous day which only showed 2 small left breast cysts proximal and 7 mm in size.  Recommended trying conservative measures like low-fat diet caffeine avoidance evening primrose oil and NSAIDs as needed.  She reports that she is try some of these measures but the pain still persists in the left lateral portion of the breast.  She also still reports that she is having discomfort with the left shoulder lipoma particularly when she lies on her left side.  She has an appointment with Dr. Marla Roe tomorrow to evaluate her left breast pain to see if that is related to the left breast implant that she has.  Past Medical History: Past Medical History:  Diagnosis Date  . Hypertension   . Kidney stones      Past Surgical History: Past Surgical History:  Procedure Laterality Date  . AUGMENTATION MAMMAPLASTY Left 2005   left breast never devleoped   . BREAST CYST ASPIRATION Left 2014  . BREAST SURGERY Left Nov 2013   aspiration  . BREAST SURGERY Left 2005   implant in [place]  . 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  Marland Kitchen KIDNEY SURGERY Left 2002   stone    Home Medications: Prior to Admission medications   Not on File    Allergies: No Known Allergies  Review of Systems: Review of Systems  Constitutional: Negative for chills and fever.  Respiratory: Negative for shortness of breath.   Cardiovascular: Negative for chest pain.  Gastrointestinal: Negative for abdominal pain, nausea and vomiting.  Skin: Negative for rash.    Physical Exam BP 117/71   Pulse 98   Temp 98.5 F (36.9 C)   Ht 5' (1.524 m)   Wt 138 lb (62.6 kg)   LMP 01/21/2020   SpO2 98%    BMI 26.95 kg/m  CONSTITUTIONAL: No acute distress HEENT:  Normocephalic, atraumatic, extraocular motion intact. RESPIRATORY:  Normal respiratory effort without pathologic use of accessory muscles. CARDIOVASCULAR: Regular rhythm and rate. BREAST: Left breast without any palpable masses.  She has a well-healed inframammary fold scars from her implant placement.  The area of pain she is pointing at his 3 o'clock position about 6 cm from the nipple. MSK: Patient has of left posterior shoulder lipoma which is stable in size mobile, with no specific tenderness to palpation and no overlying erythema or induration. NEUROLOGIC:  Motor and sensation is grossly normal.  Cranial nerves are grossly intact. PSYCH:  Alert and oriented to person, place and time. Affect is normal.  Labs/Imaging: None recently  Assessment and Plan: This is a 39 y.o. female with left breast pain and left posterior shoulder lipoma.  -Discussed with the patient that it would be unusual for her to have pain from these small cysts, acutely because they are located at the 1 o'clock position but her pain is small at the 3 o'clock position.  She has an appointment tomorrow with Dr. Marla Roe with plastic surgery to evaluate her implant as a potential reason for her pain. -Discussed with her that in the meantime we can at least tentatively schedule her for a left posterior shoulder lipoma excision and we may have to add partial lumpectomy for the  2 small cysts based on Dr. Eusebio Friendly assessment.  If that is the case, discussed with her that she will also need RF tag localization prior to surgery due to the very small size of the cysts.  Discussed with her the risks of bleeding, infection, injury to surrounding structures, postoperative restrictions, and she is willing to proceed.  She understands he will need COVID-19 testing prior to surgery as well. -For now, we will tentatively schedule her surgery for 03/23/2020.  We will have a  follow-up appointment on 03/08/2020 to review any further additional findings and to finalize her surgical plans.  Face-to-face time spent with the patient and care providers was 25 minutes, with more than 50% of the time spent counseling, educating, and coordinating care of the patient.     Melvyn Neth, [facility]

## 2020-02-15 ENCOUNTER — Other Ambulatory Visit: Payer: Self-pay

## 2020-02-15 ENCOUNTER — Encounter: Payer: Self-pay | Admitting: Plastic Surgery

## 2020-02-15 ENCOUNTER — Telehealth: Payer: Self-pay | Admitting: Surgery

## 2020-02-15 ENCOUNTER — Ambulatory Visit (INDEPENDENT_AMBULATORY_CARE_PROVIDER_SITE_OTHER): Payer: BC Managed Care – PPO | Admitting: Plastic Surgery

## 2020-02-15 VITALS — BP 132/70 | HR 84 | Temp 98.3°F | Ht 60.0 in | Wt 140.0 lb

## 2020-02-15 DIAGNOSIS — N644 Mastodynia: Secondary | ICD-10-CM

## 2020-02-15 NOTE — Telephone Encounter (Signed)
Patient has been advised of Pre-Admission date/time, COVID Testing date and Surgery date.  Surgery Date: 03/23/20 Preadmission Testing Date: 03/16/20 (phone 8a-1p) Covid Testing Date: 03/21/20 - patient advised to go to the [facility] ([address]) between 8a-1p   Patient has been made aware to call [phone], between 1-3:00pm the day before surgery, to find out what time to arrive for surgery.   This has also been mailed to the patient.

## 2020-02-16 ENCOUNTER — Encounter: Payer: Self-pay | Admitting: Plastic Surgery

## 2020-02-16 NOTE — Progress Notes (Signed)
Patient ID: Wendy Ellis, female    DOB: Oct 06, 1980, 38 y.o.   MRN: [id number]   Chief Complaint  Patient presents with  . Consult  . Breast Problem    The patient is a 39 year old female here for evaluation of her breast.  The patient states that she had left breast hypoplasia noted for many years.  She had a implant placed in [place] ~ 15 year ago.  She thinks that it is under her muscle.  She did not have one placed in her right breast.  The left breast has gotten larger and she is concerned about seroma formation. She is also concerned about lateral left breast pain. She has been drained in the past.  She also has a lipoma on the posterior proximal left arm.  I don't think this is related to the seroma.  She already has plans to have this removed.  I don't feel any left breast seroma at this time but it is difficult to tell as the breast is tight.  It is larger than the right breast.  No lumps noted.  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  Needs an updated mammogram.  She is seeing Dr. Hampton Abbot.   Review of Systems  Constitutional: Negative.  Negative for activity change and appetite change.  HENT: Negative.   Eyes: Negative.   Respiratory: Negative.  Negative for chest tightness and shortness of breath.   Cardiovascular: Negative for leg swelling.  Gastrointestinal: Negative for abdominal distention.  Endocrine: Negative.   Genitourinary: Negative.   Musculoskeletal: Negative.   Neurological: Negative.   Hematological: Negative.     Past Medical History:  Diagnosis Date  . Hypertension   . Kidney stones     Past Surgical History:  Procedure Laterality Date  . AUGMENTATION MAMMAPLASTY Left 2005   left breast never devleoped   . BREAST CYST ASPIRATION Left 2014  . BREAST SURGERY Left Nov 2013   aspiration  . BREAST SURGERY Left 2005   implant in [place]  . BREAST SURGERY Left 09/29/2012   Vacuum biopsy of the recurrent left breast lesion showing an  apocrine cyst without malignancy.  Marland Kitchen KIDNEY SURGERY Left 2002   stone      Current Outpatient Medications:  Marland Kitchen  METOPROLOL-HYDROCHLOROTHIAZIDE PO, Take by mouth., Disp: , Rfl:  .  Urea (METOPIC) 41 % CREA, Apply topically. (Patient not taking: Reported on 02/15/2020), Disp: , Rfl:    Objective:   Vitals:   02/15/20 1540  BP: 132/70  Pulse: 84  Temp: 98.3 F (36.8 C)  SpO2: 100%    Physical Exam Vitals and nursing note reviewed.  Constitutional:      Appearance: Normal appearance.  HENT:     Head: Normocephalic and atraumatic.  Cardiovascular:     Rate and Rhythm: Normal rate.     Pulses: Normal pulses.  Pulmonary:     Effort: Pulmonary effort is normal. No respiratory distress.  Abdominal:     General: Abdomen is flat. There is no distension.  Skin:    General: Skin is warm.     Capillary Refill: Capillary refill takes less than 2 seconds.  Neurological:     General: No focal deficit present.     Mental Status: She is alert and oriented to person, place, and time.  Psychiatric:        Mood and Affect: Mood normal.        Behavior: Behavior normal.     Assessment &  Plan:  Pain of left breast  There is concern for ruptured implant on the left.  We will plan on a Mammogram.  Patient will think about what she would like to do.  Options are: 1. Removal of left implant and placement of a new smaller implant.   2. Remioval of left implant without replacement +/- mastopexy. 3. Removal and replacement of left impant and placement of right implant.  Quote provided.  Patient will let us know her thoughts and we will talk after she has her MM.    Pictures were obtained of the patient and placed in the chart with the patient's or guardian's permission.  Pena Pobre, DO

## 2020-02-17 NOTE — Addendum Note (Signed)
Addended by: Wallace Going on: 02/17/2020 12:29 PM   Modules accepted: Orders

## 2020-02-22 NOTE — Addendum Note (Signed)
Addended by: Wallace Going on: 02/22/2020 02:23 PM   Modules accepted: Orders

## 2020-02-23 ENCOUNTER — Telehealth: Payer: Self-pay | Admitting: Plastic Surgery

## 2020-02-23 DIAGNOSIS — N644 Mastodynia: Secondary | ICD-10-CM

## 2020-02-23 NOTE — Telephone Encounter (Signed)
-----   Message from Broadus John sent at 02/23/2020 11:17 AM EST ----- [facility] is saying that the patient should have IMG 5533 for a diagnostic unilateral mmg and IMG 5531 for the Ultrasoung. They wouldn't send an updated order for cosign. Can you update the orders please? Thank you! ----- Message ----- From: Wallace Going, DO Sent: 02/22/2020   2:23 PM EST To: Broadus John  done ----- Message ----- From: Broadus John Sent: 02/21/2020   8:41 AM EST To: Loel Lofty Sterling Mondo, DO  Can you change the order for the mmg and u/s to [facility]? Patient requested [place] location. Thanks!

## 2020-03-07 ENCOUNTER — Telehealth: Payer: BC Managed Care – PPO | Admitting: Plastic Surgery

## 2020-03-08 ENCOUNTER — Other Ambulatory Visit: Payer: Self-pay

## 2020-03-08 ENCOUNTER — Telehealth (INDEPENDENT_AMBULATORY_CARE_PROVIDER_SITE_OTHER): Payer: BC Managed Care – PPO | Admitting: Surgery

## 2020-03-08 ENCOUNTER — Encounter: Payer: Self-pay | Admitting: Surgery

## 2020-03-08 DIAGNOSIS — D1722 Benign lipomatous neoplasm of skin and subcutaneous tissue of left arm: Secondary | ICD-10-CM

## 2020-03-08 DIAGNOSIS — N644 Mastodynia: Secondary | ICD-10-CM | POA: Diagnosis not present

## 2020-03-08 NOTE — Progress Notes (Signed)
Virtual Visit via Telephone Note  I connected with [name] on 03/08/20 at  4:45 PM EST by telephone and verified that I am speaking with the correct person using two identifiers.  Location: Patient:  Home Provider: Office   I discussed the limitations, risks, security and privacy concerns of performing an evaluation and management service by telephone and the availability of in person appointments. I also discussed with the patient that there may be a patient responsible charge related to this service. The patient expressed understanding and agreed to proceed.   History of Present Illness: This is a 39 year old female seen today as virtual visit for follow-up of her left shoulder lipoma as well as left breast pain.  Patient is currently scheduled for a left shoulder lipoma excision on 03/23/2020.  She has seen Dr. Marla Roe for evaluation of left breast pain and currently there is concern for a ruptured left breast implant.  She discussed with the patient the potential options for implant removal +/- replacement +/- new implant on the right breast as well.  The patient currently is leaning towards removal of the left breast implant with placement of a new implant and also placement of a right breast implant.  Looking at the notes, Dr. Marla Roe had requested repeat mammography on the patient but she has had a recent mammogram on 8/31 and a screening mammogram on 4/20.  Patient currently reports that she has been doing well from the left shoulder standpoint denies any worsening issues with the mass and is ready for surgery.   Observations/Objective: Patient does not appear to be in any acute distress and is able to have this conversation in full sentences without any shortness of breath.  Assessment and Plan: 39 year old female with left breast pain and left shoulder lipoma.  -Patient is currently scheduled for a lipoma excision on 03/23/2020.  Reviewed with her again the  risks of bleeding, infection, injury to surrounding structures.  Discussed with her that given the size of the mass, most likely will also the a drain to help with seroma formation. -I will contact Dr. Marla Roe to see if repeat imaging is still needed or if otherwise we could try to coordinate surgical dates to do this as a combined case.  Discussed with the patient that if we are not able to coordinate times, we could potentially do this separately but otherwise I would be flexible to try to align with her schedule.  Follow Up Instructions:    I discussed the assessment and treatment plan with the patient. The patient was provided an opportunity to ask questions and all were answered. The patient agreed with the plan and demonstrated an understanding of the instructions.   The patient was advised to call back or seek an in-person evaluation if the symptoms worsen or if the condition fails to improve as anticipated.  I provided 15 minutes of non-face-to-face time during this encounter.   Olean Ree, MD

## 2020-03-10 ENCOUNTER — Other Ambulatory Visit: Payer: Self-pay | Admitting: Surgical

## 2020-03-10 DIAGNOSIS — N644 Mastodynia: Secondary | ICD-10-CM

## 2020-03-16 ENCOUNTER — Encounter
Admission: RE | Admit: 2020-03-16 | Discharge: 2020-03-16 | Disposition: A | Discharge status: Home / Self Care | Payer: BC Managed Care – PPO | Source: Ambulatory Visit | Attending: Surgery | Admitting: Surgery

## 2020-03-16 ENCOUNTER — Other Ambulatory Visit: Payer: Self-pay

## 2020-03-16 HISTORY — DX: Personal history of urinary calculi: Z87.442

## 2020-03-16 NOTE — Patient Instructions (Signed)
Your procedure is scheduled on: 03/23/20 Report to [facility] after checking in at the [facility]. To find out your arrival time please call [phone] between 1PM - 3PM on 03/22/20.  Remember: Instructions that are not followed completely may result in serious medical risk, up to and including death, or upon the discretion of your surgeon and anesthesiologist your surgery may need to be rescheduled.     _X__ 1. Do not eat food after midnight the night before your procedure.                 No gum chewing or hard candies. You may drink clear liquids up to 2 hours                 before you are scheduled to arrive for your surgery- DO not drink clear                 liquids within 2 hours of the start of your surgery.                 Clear Liquids include:  water, apple juice without pulp, clear carbohydrate                 drink such as Clearfast or Gatorade, Black Coffee or Tea (Do not add                 anything to coffee or tea). Diabetics water only  __X__2.  On the morning of surgery brush your teeth with toothpaste and water, you                 may rinse your mouth with mouthwash if you wish.  Do not swallow any              toothpaste of mouthwash.     _X__ 3.  No Alcohol for 24 hours before or after surgery.   _X__ 4.  Do Not Smoke or use e-cigarettes For 24 Hours Prior to Your Surgery.                 Do not use any chewable tobacco products for at least 6 hours prior to                 surgery.  ____  5.  Bring all medications with you on the day of surgery if instructed.   __X__  6.  Notify your doctor if there is any change in your medical condition      (cold, fever, infections).     Do not wear jewelry, make-up, hairpins, clips or nail polish. Do not wear lotions, powders, or perfumes.  Do not shave 48 hours prior to surgery. Men may shave face and neck. Do not bring valuables to the hospital.    [facility] is not responsible for any belongings or valuables.  Contacts, dentures/partials or body piercings may not be worn into surgery. Bring a case for your contacts, glasses or hearing aids, a denture cup will be supplied. Leave your suitcase in the car. After surgery it may be brought to your room. For patients admitted to the hospital, discharge time is determined by your treatment team.   Patients discharged the day of surgery will not be allowed to drive home.   Please read over the following fact sheets that you were given:   MRSA Information  __X__ Take these medicines the morning of surgery  with A SIP OF WATER:    1. NONE  2.   3.   4.  5.  6.  ____ Fleet Enema (as directed)   __X__ Use CHG Soap/SAGE wipes as directed  ____ Use inhalers on the day of surgery  ____ Stop metformin/Janumet/Farxiga 2 days prior to surgery    ____ Take 1/2 of usual insulin dose the night before surgery. No insulin the morning          of surgery.   ____ Stop Blood Thinners Coumadin/Plavix/Xarelto/Pleta/Pradaxa/Eliquis/Effient/Aspirin  on   Or contact your Surgeon, Cardiologist or Medical Doctor regarding  ability to stop your blood thinners  __X__ Stop Anti-inflammatories 7 days before surgery such as Advil, Ibuprofen, Motrin,  BC or Goodies Powder, Naprosyn, Naproxen, Aleve, Aspirin   YOU MAY TAKE TYLENOL IF NEEDED  __X__ Stop all herbal supplements, fish oil or vitamin E until after surgery.    ____ Bring C-Pap to the hospital.

## 2020-03-21 ENCOUNTER — Other Ambulatory Visit: Payer: BC Managed Care – PPO

## 2020-03-21 ENCOUNTER — Telehealth: Payer: Self-pay | Admitting: Surgery

## 2020-03-21 NOTE — Telephone Encounter (Signed)
Patient comes into the clinic this morning wanting to cancel her Covid test for today (03/21/20) and cancel her surgery for 03/23/20.  She states that she now wants to do a combined case with plastic surgery for cosmetic reconstruction of her breast.  Spoke with Dr. Hampton Abbot, we will cancel surgery for now at patient request.  Patient is to call us and let us know what she finds out with plastic surgery as her insurance may not cover due to this being cosmetic.  Will await further instructions.

## 2020-03-23 ENCOUNTER — Encounter: Admission: RE | Payer: Self-pay | Source: Home / Self Care

## 2020-03-23 ENCOUNTER — Ambulatory Visit: Admission: RE | Admit: 2020-03-23 | Payer: BC Managed Care – PPO | Source: Home / Self Care | Admitting: Surgery

## 2020-03-23 SURGERY — EXCISION LIPOMA
Anesthesia: General | Laterality: Left

## 2020-04-03 ENCOUNTER — Other Ambulatory Visit: Payer: BC Managed Care – PPO

## 2020-09-25 ENCOUNTER — Other Ambulatory Visit: Payer: Self-pay | Admitting: Primary Care

## 2020-09-25 DIAGNOSIS — Z1231 Encounter for screening mammogram for malignant neoplasm of breast: Secondary | ICD-10-CM

## 2020-10-02 ENCOUNTER — Encounter: Payer: Self-pay | Admitting: Surgery

## 2020-10-02 ENCOUNTER — Ambulatory Visit (INDEPENDENT_AMBULATORY_CARE_PROVIDER_SITE_OTHER): Payer: BC Managed Care – PPO | Admitting: Surgery

## 2020-10-02 ENCOUNTER — Other Ambulatory Visit: Payer: Self-pay

## 2020-10-02 ENCOUNTER — Ambulatory Visit
Admission: RE | Admit: 2020-10-02 | Discharge: 2020-10-02 | Disposition: A | Discharge status: Home / Self Care | Payer: BC Managed Care – PPO | Source: Ambulatory Visit | Attending: Primary Care | Admitting: Primary Care

## 2020-10-02 VITALS — BP 147/83 | HR 66 | Temp 98.4°F | Ht 61.0 in | Wt 138.2 lb

## 2020-10-02 DIAGNOSIS — Z1231 Encounter for screening mammogram for malignant neoplasm of breast: Secondary | ICD-10-CM | POA: Diagnosis not present

## 2020-10-02 DIAGNOSIS — N644 Mastodynia: Secondary | ICD-10-CM

## 2020-10-02 DIAGNOSIS — D1722 Benign lipomatous neoplasm of skin and subcutaneous tissue of left arm: Secondary | ICD-10-CM | POA: Diagnosis not present

## 2020-10-02 NOTE — Progress Notes (Signed)
10/02/2020  History of Present Illness: Wendy Ellis is a 40 y.o. female presenting for follow up of left breast pain and left shoulder lipoma. She had seen me back in 03/2020 and we had scheduled her for left shoulder lipoma excision, but she canceled her surgery.  She had also seen Dr. Marla Roe for left breast pain and the concern was that her left breast implant was very old and there was concern for ruptured implant as the cause for her pain.  Unfortunately this would not be covered by insurance.  Today, the patient presents for follow up.  She wants to get back on track to proceed with both breast surgery and lipoma excision.  She had been dealing with some other issues over the past few months.  She reports that she's still having left breast pain, particularly in the lateral aspect, and also having left shoulder/upper arm pain from the lipoma.  Does not feel that the lipoma is bigger in size.  She feels a band-like area of firmness in the left lateral breast where her pain is.  Of note, she has her yearly screening mammogram today.  Past Medical History: Past Medical History:  Diagnosis Date   History of kidney stones    Hypertension      Past Surgical History: Past Surgical History:  Procedure Laterality Date   AUGMENTATION MAMMAPLASTY Left 2005   left breast never devleoped    BREAST CYST ASPIRATION Left 2014   BREAST SURGERY Left Nov 2013   aspiration   BREAST SURGERY Left 2005   implant in [place]   BREAST SURGERY Left 09/29/2012   Vacuum biopsy of the recurrent left breast lesion showing an apocrine cyst without malignancy.   CESAREAN SECTION     KIDNEY SURGERY Left 2002   stone    Home Medications: Prior to Admission medications   Medication Sig Start Date End Date Taking? Authorizing Provider  metoprolol succinate (TOPROL-XL) 50 MG 24 hr tablet Take 50 mg by mouth daily. 02/24/20  Yes [provider]    Allergies: No Known  Allergies  Review of Systems: Review of Systems  Constitutional:  Negative for chills and fever.  HENT:  Negative for hearing loss.   Respiratory:  Negative for shortness of breath.   Cardiovascular:  Negative for chest pain.  Gastrointestinal:  Negative for abdominal pain, nausea and vomiting.  Skin:        Left breast pain, left shoulder pain over lipoma.   Physical Exam BP (!) 147/83   Pulse 66   Temp 98.4 F (36.9 C) (Oral)   Ht 5\' 1"  (1.549 m)   Wt 138 lb 3.2 oz (62.7 kg)   LMP 09/02/2020   SpO2 99%   BMI 26.11 kg/m  CONSTITUTIONAL: No acute distress, well nourished HEENT:  Normocephalic, atraumatic, extraocular motion intact. RESPIRATORY:  Normal respiratory effort without pathologic use of accessory muscles. CARDIOVASCULAR:  Regular rhythm and rate. BREAST:  left breast has an area of firmness in upper outer quadrant, very lateral portion, which feels like scar tissue.  No other areas of concern.  Left axilla without lymphadenopathy. SKIN:  Left posterior/inferior shoulder lipoma, appears same size.  Soft, mobile, with tenderness when squeezing the area.  No erythema or induration. NEUROLOGIC:  Motor and sensation is grossly normal.  Cranial nerves are grossly intact. PSYCH:  Alert and oriented to person, place and time. Affect is normal.  Labs/Imaging: Left Mammogram 12/07/19:   FINDINGS: There is mammographically stable extremely dense tissue in  the upper outer left breast at the location of the mass felt by the patient, marked with a metallic marker. There is also a biopsy marker clip in that region of the breast. No mammographically visible mass or other findings suspicious for malignancy.   Mammographic images were processed with CAD.   On physical exam, the patient described diffuse tenderness throughout the upper-outer left breast. There was no palpable mass today.   Targeted ultrasound is performed, showing a 4 mm cyst containing low-level internal echoes in  the 1 o'clock position of the left breast, 7 cm from the nipple, at the location of patient concern. Deeper in that portion of the breast, there was a 6 mm simple cyst. No solid masses were seen.   IMPRESSION: Benign left breast cysts.  No evidence of malignancy.   Assessment and Plan: This is a 40 y.o. female with left breast pain and left shoulder lipoma  --The patient reports continued pain in the left breast and the left shoulder.  She would like to restart conversations and discussion to have surgery for the left breast and excision of the left shoulder lipoma.  Dr. Marla Roe had discussed with her three possible options when she saw her last on 02/15/20.  The patient has not seen Dr. Marla Roe since, and I think it would be best to have the patient see her again to discuss and finalize surgical plans.  Our offices can then coordinate to find a date/time to do both surgeries in the same anesthesia setting at [facility].  I gave her the phone number to Dr. Eusebio Friendly office so she can set up an appointment.  --I also suggested that if she wanted to only have the lipoma excision done due to finances or timing that could be coordinated with Dr. Eusebio Friendly office, then she can contact us and we can schedule her for lipoma excision alone.  Discussed the surgery again with her, the type of incision, risks, post-op drain given the size of the lipoma, and activity restrictions. --She will let us know about her appointment with Dr. Marla Roe and timing for surgery.  Face-to-face time spent with the patient and care providers was 25 minutes, with more than 50% of the time spent counseling, educating, and coordinating care of the patient.     Melvyn Neth, [facility]

## 2020-10-02 NOTE — Patient Instructions (Addendum)
Please call provider below to schedule a follow up appointment:  [facility] [place], DO [address], [place], [place] [address] Phone: [phone]     If you have any concerns or questions, please feel free to call our office.

## 2020-12-08 ENCOUNTER — Encounter: Payer: Self-pay | Admitting: Plastic Surgery

## 2020-12-08 ENCOUNTER — Other Ambulatory Visit: Payer: Self-pay

## 2020-12-08 ENCOUNTER — Ambulatory Visit (INDEPENDENT_AMBULATORY_CARE_PROVIDER_SITE_OTHER): Payer: BC Managed Care – PPO | Admitting: Plastic Surgery

## 2020-12-08 DIAGNOSIS — N644 Mastodynia: Secondary | ICD-10-CM | POA: Diagnosis not present

## 2020-12-08 DIAGNOSIS — D1722 Benign lipomatous neoplasm of skin and subcutaneous tissue of left arm: Secondary | ICD-10-CM

## 2020-12-08 NOTE — Progress Notes (Signed)
Subjective:    Patient ID: Wendy Ellis, female    DOB: 03/21/1981, 40 y.o.   MRN: [id number]  The patient is a 40 year old female here for evaluation of her left arm and her left breast.  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  She had an implant placed when she was in [place] approximately 15 to 16 years ago.  She is not sure what size it is but thinks it might be under the muscle.  She only has 1 on the left side and does not have an implant on the right side.  She notices pain and tenderness of the left breast.  She has noticed some drainage in the past.  I do not see any today.  The breast is firm and and tender with it getting worse according to the patient.  She also has a large lipoma that is approximately 5 x 7 cm on the posterior axillary aspect of her left arm.  She was going to have this removed by Dr. Hampton Abbot but it is becoming difficult to get it arranged.  The patient would like to move forward with taking care of both at the same time.  She did have a mammogram in June which was negative     Review of Systems  Constitutional: Negative.   Eyes: Negative.   Respiratory: Negative.    Cardiovascular: Negative.   Gastrointestinal: Negative.   Endocrine: Negative.   Genitourinary: Negative.   Skin:  Negative for color change and wound.  Neurological: Negative.   Hematological: Negative.       Objective:   Physical Exam Vitals and nursing note reviewed.  Constitutional:      Appearance: Normal appearance.  HENT:     Head: Normocephalic and atraumatic.  Cardiovascular:     Rate and Rhythm: Normal rate.     Pulses: Normal pulses.  Pulmonary:     Effort: Pulmonary effort is normal. No respiratory distress.  Abdominal:     General: Abdomen is flat. There is no distension.     Palpations: There is no mass.     Tenderness: There is no abdominal tenderness.     Hernia: No hernia is present.  Musculoskeletal:        General: Swelling  and deformity present.  Skin:    General: Skin is warm.     Capillary Refill: Capillary refill takes less than 2 seconds.     Coloration: Skin is not jaundiced.     Findings: No bruising.  Neurological:     General: No focal deficit present.     Mental Status: She is alert and oriented to person, place, and time.  Psychiatric:        Mood and Affect: Mood normal.        Behavior: Behavior normal.        Thought Content: Thought content normal.        Assessment & Plan:     ICD-10-CM   1. Pain of left breast  N64.4     2. Lipoma of left upper extremity  D17.22       We will provide the patient with a quote for bilateral implant placement with removal of the left side.  We will also submit and see if this can be approved.  Her left arm lipoma we can remove at the same time.  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  I think we will bypass the ultrasound since the mammogram was negative.  Clinically she does have a ruptured left breast implant  Pictures were obtained of the patient and placed in the chart with the patient's or guardian's permission.

## 2020-12-13 ENCOUNTER — Ambulatory Visit: Payer: BC Managed Care – PPO | Admitting: Surgery

## 2021-01-08 ENCOUNTER — Ambulatory Visit: Payer: BC Managed Care – PPO | Admitting: Dermatology

## 2021-01-09 ENCOUNTER — Other Ambulatory Visit: Payer: Self-pay

## 2021-01-09 ENCOUNTER — Ambulatory Visit (INDEPENDENT_AMBULATORY_CARE_PROVIDER_SITE_OTHER): Payer: BC Managed Care – PPO | Admitting: Plastic Surgery

## 2021-01-09 DIAGNOSIS — N644 Mastodynia: Secondary | ICD-10-CM

## 2021-01-09 DIAGNOSIS — D1722 Benign lipomatous neoplasm of skin and subcutaneous tissue of left arm: Secondary | ICD-10-CM

## 2021-01-09 DIAGNOSIS — N6321 Unspecified lump in the left breast, upper outer quadrant: Secondary | ICD-10-CM

## 2021-01-09 NOTE — Progress Notes (Addendum)
   Subjective:    Patient ID: Wendy Ellis, female    DOB: 1980/11/08, 40 y.o.   MRN: [id number]  The patient is a 40 year old female joining me by phone as she does not have access to the computer at this time.  She is being seen for a lipoma of her left arm.  She states that it is gotten larger over the last couple of years and she would like to have it removed.  It is approximately 5 x 7 cm on the posterior aspect of the left arm.  There are no other associated symptoms with the lipoma.  She also had a breast implant placed in [place] 16 years ago.  It was placed on her left breast for asymmetry and hypoplasia.  She does not have an implant on the right side.  The patient complains that the left breast is firm.  It appears that the implant may be ruptured.  She had a mammogram in June which was negative.   Review of Systems  Constitutional: Negative.   HENT: Negative.    Eyes: Negative.   Respiratory: Negative.    Cardiovascular: Negative.   Gastrointestinal: Negative.   Endocrine: Negative.   Genitourinary: Negative.   Neurological: Negative.   Hematological: Negative.       Objective:   Physical Exam     Assessment & Plan:     ICD-10-CM   1. Lipoma of left upper extremity  D17.22     2. Mass of upper outer quadrant of left breast  N63.21     3. Pain of left breast  N64.4       The patient would like to move ahead with excision of the left arm lipoma and placement of implants.  She will need to come in to discuss size and whether or not she wants one on each side.  We will also send her a quote.  I connected with  [facility] on 01/23/21 by a video enabled telemedicine application and verified that I am speaking with the correct person using two identifiers. Patient in [place] and I was in the office.  I spent 15 minutes in the following  manner: review of chart, review with the patient, discussion with the patient and answering questions, documentation.   I  discussed the limitations of evaluation and management by telemedicine. The patient expressed understanding and agreed to proceed.

## 2021-01-23 NOTE — Addendum Note (Signed)
Addended by: Wallace Going on: 01/23/2021 01:01 PM   Modules accepted: Level of Service

## 2021-03-14 ENCOUNTER — Other Ambulatory Visit: Payer: Self-pay

## 2021-03-14 ENCOUNTER — Ambulatory Visit (INDEPENDENT_AMBULATORY_CARE_PROVIDER_SITE_OTHER): Payer: BC Managed Care – PPO | Admitting: Physician Assistant

## 2021-03-14 ENCOUNTER — Encounter: Payer: Self-pay | Admitting: Physician Assistant

## 2021-03-14 VITALS — BP 131/81 | HR 71 | Ht 62.0 in | Wt 139.0 lb

## 2021-03-14 DIAGNOSIS — D1722 Benign lipomatous neoplasm of skin and subcutaneous tissue of left arm: Secondary | ICD-10-CM

## 2021-03-14 DIAGNOSIS — Z719 Counseling, unspecified: Secondary | ICD-10-CM

## 2021-03-14 DIAGNOSIS — N644 Mastodynia: Secondary | ICD-10-CM

## 2021-03-14 MED ORDER — ONDANSETRON 4 MG PO TBDP: 4.0000 mg | ORAL_TABLET | Freq: Three times a day (TID) | ORAL | 0 refills | Status: AC | PRN

## 2021-03-14 MED ORDER — CEPHALEXIN 500 MG PO CAPS: 500.0000 mg | ORAL_CAPSULE | Freq: Four times a day (QID) | ORAL | 0 refills | Status: AC

## 2021-03-14 MED ORDER — HYDROCODONE-ACETAMINOPHEN 5-325 MG PO TABS: 1.0000 | ORAL_TABLET | Freq: Four times a day (QID) | ORAL | 0 refills | Status: AC | PRN

## 2021-03-14 NOTE — Progress Notes (Signed)
Patient ID: Wendy Ellis, female    DOB: 04-Nov-1980, 40 y.o.   MRN: [id number]  Chief Complaint  Patient presents with   Pre-op Exam      ICD-10-CM   1. Lipoma of left upper extremity  D17.22     2. Pain of left breast  N64.4        History of Present Illness: Wendy Ellis is a 40 y.o.  female  with a history of left arm lipoma and left breast implant placement.  She presents for preoperative evaluation for upcoming procedure, lipoma excision and bilateral breast implant placement, scheduled for 03/29/2021 with Dr. Marla Roe.  The patient has not had problems with anesthesia.  She denies any personal or family history of blood clots or clotting disorder, personal history of cancer, hormone replacement, smoking, diabetes, or other risk factors for poor healing or thrombosis.  She takes metoprolol for HTN which is well controlled.  She tells me that she is approximately a 34 D cup and would like to remain the same size if not just a tiny bit larger.  We then discussed risks with going any larger and she is comfortable with the same size.  We will determine current gel implant size at time of exchange.  She reports that all of her records are in [place] and she is unsure as to the current size or placement relative to the muscle.   Summary of Previous Visit: Patient was initially seen for consult 12/08/2020 to discuss left breast firmness and discomfort.  She has a history of left sided breast hypoplasia and believes that she had a submuscular implant placed 15 years ago.  She denied any right-sided implant.  Reports negative mammograms 09/2020.  While her previous implant placement was unilateral (left), discussed left-sided implant removal and bilateral implant placement.  Clinically she was noted to have ruptured left breast implant.  As for her lipoma, noted to be approximately 5 x 7 cm on posterior aspect left arm.  Job: Cook at [employer].  PMH Significant for: Previous  left-sided implant placement, left arm lipoma, HTN.   Past Medical History: Allergies: No Known Allergies  Current Medications:  Current Outpatient Medications:    metoprolol succinate (TOPROL-XL) 50 MG 24 hr tablet, Take 50 mg by mouth daily., Disp: , Rfl:   Past Medical Problems: Past Medical History:  Diagnosis Date   History of kidney stones    Hypertension     Past Surgical History: Past Surgical History:  Procedure Laterality Date   AUGMENTATION MAMMAPLASTY Left 2005   left breast never devleoped    BREAST BIOPSY Left 09/29/2012   benign   BREAST CYST ASPIRATION Left 2014   BREAST SURGERY Left 02/2012   aspiration   BREAST SURGERY Left 2005   implant in [place]   BREAST SURGERY Left 09/29/2012   Vacuum biopsy of the recurrent left breast lesion showing an apocrine cyst without malignancy.   CESAREAN SECTION     KIDNEY SURGERY Left 2002   stone    Social History: Social History   Socioeconomic History   Marital status: Single    Spouse name: Not on file   Number of children: Not on file   Years of education: Not on file   Highest education level: Not on file  Occupational History   Not on file  Tobacco Use   Smoking status: Never   Smokeless tobacco: Never  Vaping Use   Vaping Use: Never used  Substance and  Sexual Activity   Alcohol use: No    Alcohol/week: 0.0 standard drinks   Drug use: No   Sexual activity: Not on file  Other Topics Concern   Not on file  Social History Narrative   Not on file   Social Determinants of Health   Financial Resource Strain: Not on file  Food Insecurity: Not on file  Transportation Needs: Not on file  Physical Activity: Not on file  Stress: Not on file  Social Connections: Not on file  Intimate Partner Violence: Not on file    Family History: Family History  Problem Relation Age of Onset   Breast cancer Neg Hx     Review of Systems: ROS Denies recent illness, fevers, chills, rash.  Physical  Exam: Vital Signs BP 131/81 (BP Location: Right Arm, Patient Position: Sitting, Cuff Size: Normal)   Pulse 71   Ht 5\' 2"  (1.575 m)   Wt 139 lb (63 kg)   SpO2 100%   BMI 25.42 kg/m   Physical Exam Constitutional:      General: Not in acute distress.    Appearance: Normal appearance. Not ill-appearing.  HENT:     Head: Normocephalic and atraumatic.  Eyes:     Pupils: Pupils are equal, round Neck:     Musculoskeletal: Normal range of motion.  Cardiovascular:     Rate and Rhythm: Normal rate    Pulses: Normal pulses.  Pulmonary:     Effort: Pulmonary effort is normal. No respiratory distress.  Abdominal:     General: Abdomen is flat. There is no distension.  Musculoskeletal: Normal range of motion.  No lower extremity swelling or edema.  No varicosities.  Ambulatory. Skin:    General: Skin is warm and dry.     Findings: No erythema or rash.  Neurological:     General: No focal deficit present.     Mental Status: Alert and oriented to person, place, and time. Mental status is at baseline.     Motor: No weakness.  Psychiatric:        Mood and Affect: Mood normal.        Behavior: Behavior normal.    Assessment/Plan: The patient is scheduled for left breast implant removal, bilateral implant placement, and left arm lipoma excision with Dr. Marla Roe.  Risks, benefits, and alternatives of procedure discussed, questions answered and consent obtained.    Smoking Status: Non-smoker. Last Mammogram: 09/2020; Results: BI-RADS Category 1, negative.  Caprini Score: 4; Risk Factors include: Age, BMI greater than 25, and length of planned surgery. Recommendation for mechanical prophylaxis. Encourage early ambulation.   Pictures obtained: 12/08/2020.  Post-op Rx sent to pharmacy: Norco, Zofran, Keflex.  Patient was provided with the General Surgical Risk consent document and Pain Medication Agreement prior to their appointment.  They had adequate time to read through the risk consent  documents and Pain Medication Agreement. We also discussed them in person together during this preop appointment. All of their questions were answered to their satisfaction.  Recommended calling if they have any further questions.  Risk consent form and Pain Medication Agreement to be scanned into patient's chart.  The risks that can be encountered with and after placement of a breast implant were discussed and include the following but not limited to these: bleeding, infection, delayed healing, anesthesia risks, skin sensation changes, injury to structures including nerves, blood vessels, and muscles which may be temporary or permanent, allergies to tape, suture materials and glues, blood products, topical preparations or injected  agents, skin contour irregularities, skin discoloration and swelling, deep vein thrombosis, cardiac and pulmonary complications, pain, which may persist, fluid accumulation, wrinkling of the skin over the implanmt, changes in nipple or breast sensation, implant leakage or rupture, faulty position of the implant, persistent pain, formation of tight scar tissue around the implant (capsular contracture).   Patient was provided with the Mentor implant patient decision checklist and this was completed during today's preoperative evaluation. Patient had time to read through the information and any questions were answered to their content. Form will be scanned into patient's chart.    Electronically signed by: Krista Blue, PA-C 03/14/2021 12:21 PM

## 2021-04-02 ENCOUNTER — Telehealth: Payer: Self-pay | Admitting: Plastic Surgery

## 2021-04-02 NOTE — Telephone Encounter (Signed)
Patient called the office today with concerns about a rash.  She took a shower and that has helped some.  I encouraged her to take a Benadryl at night.  She can do Claritin or Zyrtec in the morning.  I also asked her to let me know if it does not get any better.  Patient seemed agreeable with that plan.

## 2021-04-05 ENCOUNTER — Ambulatory Visit (INDEPENDENT_AMBULATORY_CARE_PROVIDER_SITE_OTHER): Payer: BC Managed Care – PPO | Admitting: Plastic Surgery

## 2021-04-05 ENCOUNTER — Encounter: Payer: Self-pay | Admitting: Plastic Surgery

## 2021-04-05 ENCOUNTER — Other Ambulatory Visit: Payer: Self-pay

## 2021-04-05 DIAGNOSIS — D1722 Benign lipomatous neoplasm of skin and subcutaneous tissue of left arm: Secondary | ICD-10-CM

## 2021-04-05 DIAGNOSIS — N644 Mastodynia: Secondary | ICD-10-CM

## 2021-04-05 NOTE — Progress Notes (Signed)
The patient is a 40 year old female here for follow-up on her breast and arm surgery.  She had surgery last week.  A lipoma was removed from her left arm.  That area is healing really well.  There is no sign of seroma or hematoma.  She has some swelling in her breasts as expected.  She had an exchange of an implant on the left breast with complete capsulectomy. The low right side she had placement of an implant.  There is a little bit of asymmetry but it is hard to tell because I know she is got swelling on the left side due to the capsulectomy.  She should continue with the sports bra and I would like to see her back in 2 weeks.  She had called me over the weekend due to a rash and that has resolved.

## 2021-04-06 ENCOUNTER — Encounter: Payer: BC Managed Care – PPO | Admitting: Plastic Surgery

## 2021-04-20 ENCOUNTER — Ambulatory Visit (INDEPENDENT_AMBULATORY_CARE_PROVIDER_SITE_OTHER): Payer: BC Managed Care – PPO | Admitting: Surgical

## 2021-04-20 ENCOUNTER — Encounter: Payer: Self-pay | Admitting: Surgical

## 2021-04-20 ENCOUNTER — Other Ambulatory Visit: Payer: Self-pay

## 2021-04-20 DIAGNOSIS — N644 Mastodynia: Secondary | ICD-10-CM

## 2021-04-20 DIAGNOSIS — D1722 Benign lipomatous neoplasm of skin and subcutaneous tissue of left arm: Secondary | ICD-10-CM

## 2021-04-20 NOTE — Progress Notes (Signed)
41 year old female here for follow-up after excision of left arm lipoma and placement of bilateral breast implants with Dr. Marla Roe on 03/29/2021.  Of note preoperatively she had a left implant in place, underwent exchange of this implant and complete capsulectomy.  Patient reports overall she is doing well in regards to her breast, she feels asymmetric and is bothered by this.  She does have some residual swelling, particularly in the left breast.  She also reports some tenderness of the left posterior arm where she had a lipoma removed.  She reports some firmness in this area as well.  She denies any infectious symptoms.  Chaperone present on exam On exam left posterior arm incision is intact and healing well.  There is some subcutaneous fat necrosis noted with palpation.  No hematoma or subcutaneous fluid collection noted with palpation.  No overlying skin changes.  On exam of bilateral breasts, bilateral breast incisions are intact and healing well.  Bilateral NAC's are viable.  The left breast has some swelling, particularly in the left lateral aspect.  The right breast is slightly more full in the superior pole.  There is no erythema of either breast.  Recommend continue her compressive garments 24/7 for the next few weeks, recommend following up in 3 to 4 weeks for reevaluation.  We discussed that she likely has some increased swelling, particularly in the left lateral portion which may be distorting the size and shape.  We also discussed that with time the right implant should settle for improved symmetry.  All of her questions were answered to her content.  Recommend massaging the left posterior arm where she had some fat necrosis from the lipoma excision.  Call with questions or concerns.  Pictures were obtained of the patient and placed in the chart with the patient's or guardian's permission.  She can return to work on February 2, 6 weeks after surgery with restrictions of increasing  activity and lifting as able.

## 2021-04-27 ENCOUNTER — Encounter: Payer: BC Managed Care – PPO | Admitting: Surgical

## 2021-05-18 ENCOUNTER — Ambulatory Visit (INDEPENDENT_AMBULATORY_CARE_PROVIDER_SITE_OTHER): Payer: BC Managed Care – PPO | Admitting: Surgical

## 2021-05-18 DIAGNOSIS — N644 Mastodynia: Secondary | ICD-10-CM

## 2021-05-18 DIAGNOSIS — D1722 Benign lipomatous neoplasm of skin and subcutaneous tissue of left arm: Secondary | ICD-10-CM

## 2021-05-18 NOTE — Progress Notes (Signed)
40 year old female here for follow-up after excision of left arm lipoma, placement of bilateral breast implants with Dr. Marla Roe on 03/29/2021.  Preoperatively she had a left implant in place for breast asymmetry, this was exchanged as long with a complete capsulectomy. note, in the left breast she had a Mentor memory gel smooth round ultrahigh profile 160 cc implant placed, right breast she had a Mentor memory gel smooth round high-profile 150 cc implant placed.  Patient reports in regards to her breast, she feels as if she has noticed some improvement in symmetry.  She still feels as if they are asymmetric, but more symmetric than prior to surgery.  She reports some concerns about the left posterior arm lipoma, she feels a firm area that bothers her and causes her pain.  She reports pain mostly at night and pain with pressure on the area.  Chaperone present on exam On exam bilateral NAC's are viable, bilateral breast incisions are intact and well-healed.  She does have some asymmetry, the left breast is larger and slightly more ptotic.  Left posterior arm incision is intact and healing well, I do feel an area of firmness that is approximately 8 x 8 mm adjacent to the incision.  I do not appreciate any overlying changes.  Consulted with Dr. Marla Roe during today's appointment, feels as if this area of firmness is consistent with some scarring.  Recommend massaging multiple times per day and following up in 1 month for reevaluation.  Recommend calling with questions or concerns. Pictures were obtained of the patient and placed in the chart with the patient's or guardian's permission.

## 2021-05-25 ENCOUNTER — Encounter: Payer: BC Managed Care – PPO | Admitting: Plastic Surgery

## 2021-06-08 ENCOUNTER — Encounter: Payer: BC Managed Care – PPO | Admitting: Surgical

## 2021-06-26 ENCOUNTER — Ambulatory Visit: Payer: BC Managed Care – PPO | Admitting: Plastic Surgery

## 2021-08-07 ENCOUNTER — Other Ambulatory Visit: Payer: Self-pay | Admitting: Primary Care

## 2021-08-07 DIAGNOSIS — R519 Headache, unspecified: Secondary | ICD-10-CM

## 2021-08-16 ENCOUNTER — Ambulatory Visit
Admission: RE | Admit: 2021-08-16 | Discharge: 2021-08-16 | Disposition: A | Discharge status: Home / Self Care | Payer: BC Managed Care – PPO | Source: Ambulatory Visit | Attending: Primary Care | Admitting: Primary Care

## 2021-08-16 DIAGNOSIS — R519 Headache, unspecified: Secondary | ICD-10-CM | POA: Diagnosis present

## 2022-01-20 IMAGING — MG DIGITAL SCREENING BREAST BILAT IMPLANT W/ TOMO W/ CAD
9 of 15 series · 9 of 40 positions shown · non-contrast
Comparison: Previous exam(s).

CLINICAL DATA: Screening.

EXAM:
DIGITAL SCREENING BILATERAL MAMMOGRAM WITH IMPLANTS, CAD AND
TOMOSYNTHESIS
TECHNIQUE: Bilateral screening digital craniocaudal and mediolateral oblique
mammograms were obtained. Bilateral screening digital breast
tomosynthesis was performed. The images were evaluated with
computer-aided detection. Standard and/or implant displaced views
were performed.

[L MLO]
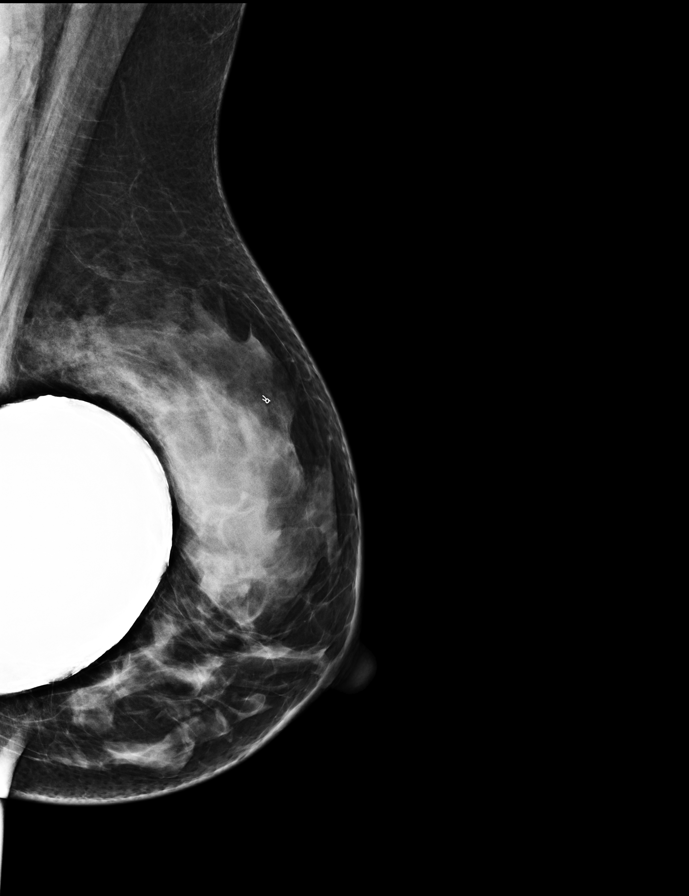

[L CC]
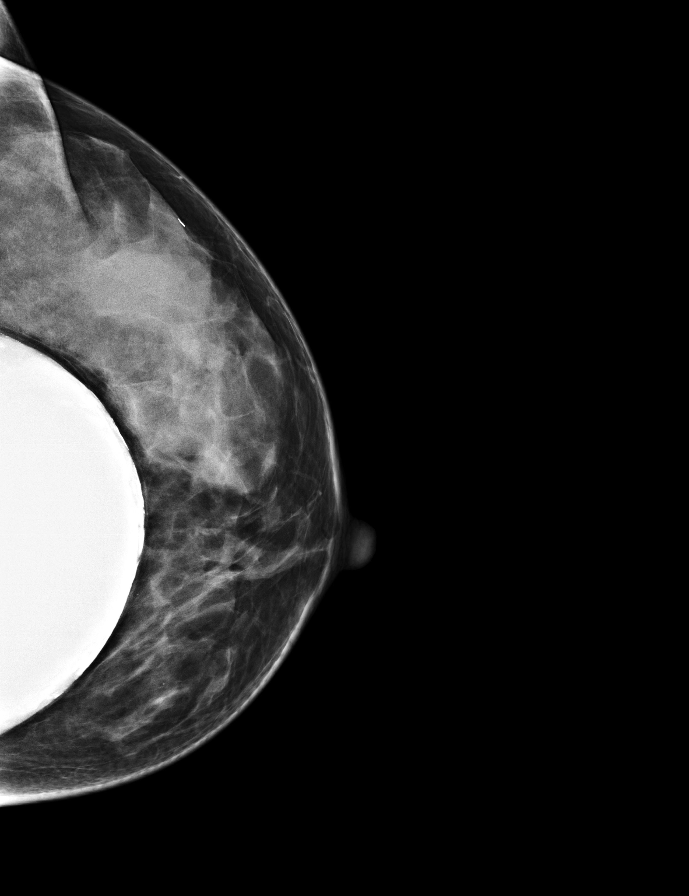

[R CC synth-2D]
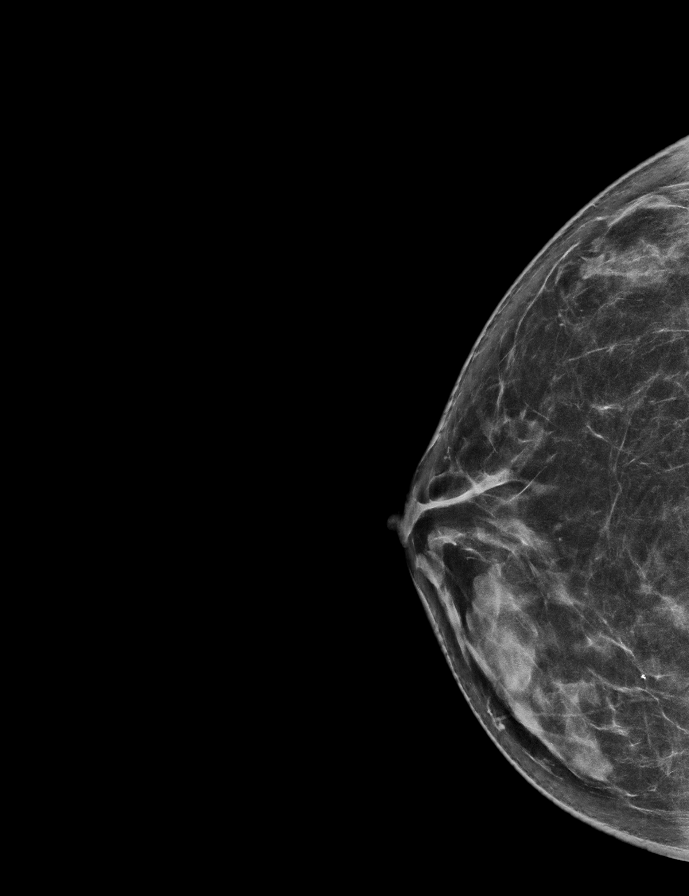

[L XCCL synth-2D]
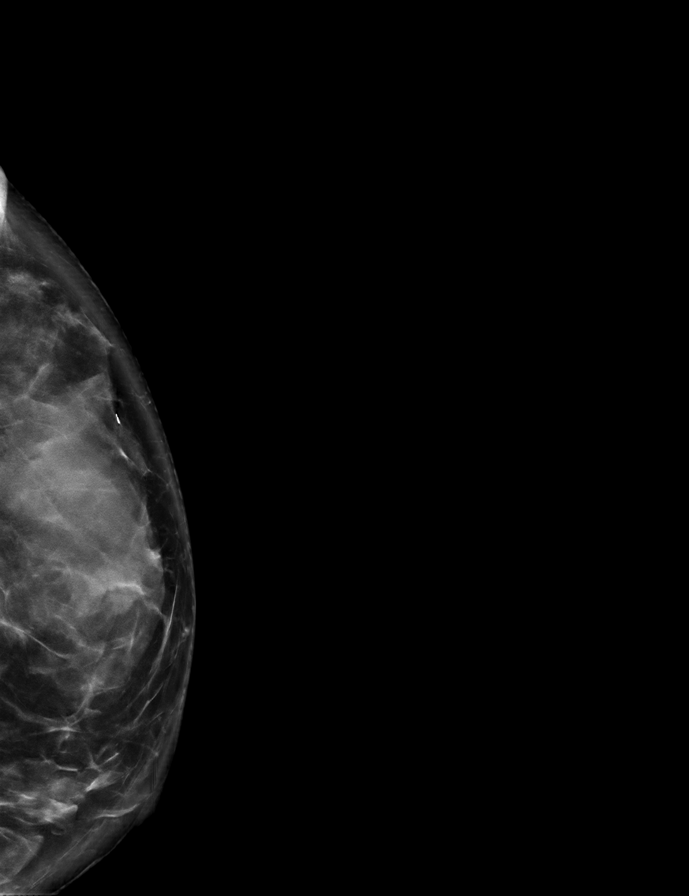

[L CC synth-2D]
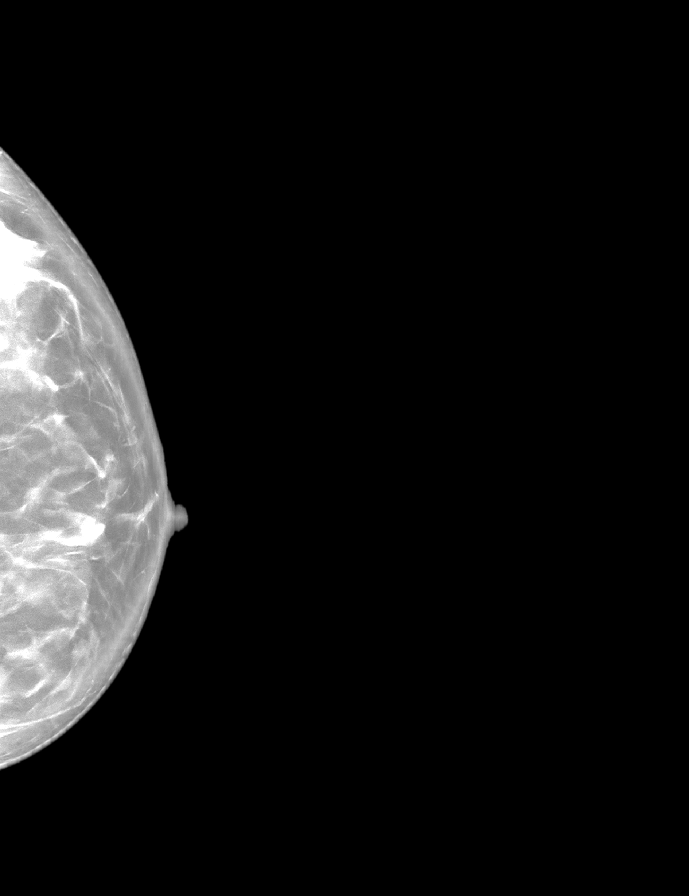

[L MLO synth-2D (1 of 3)]
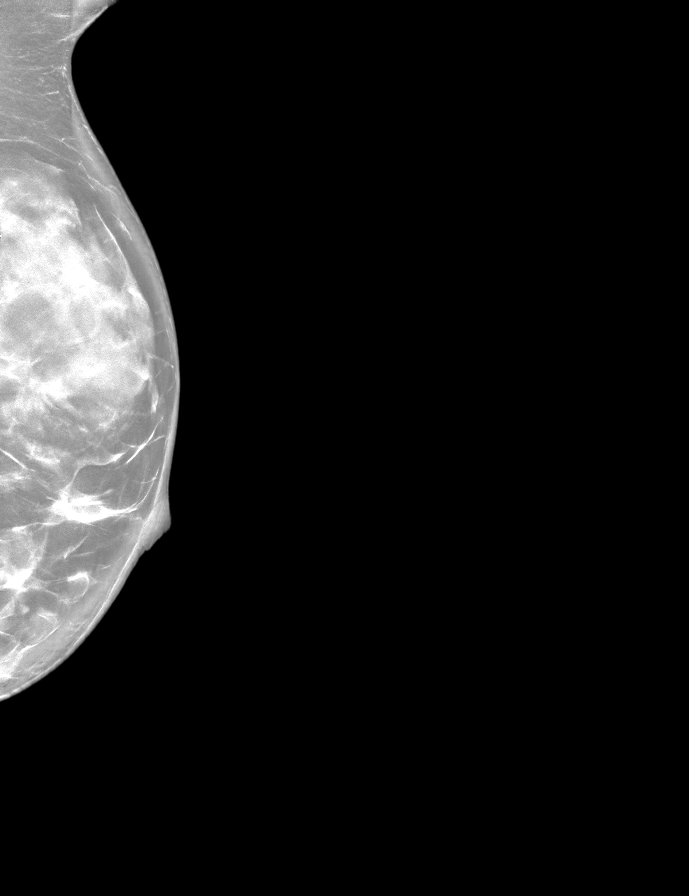

[L MLO synth-2D (2 of 3)]
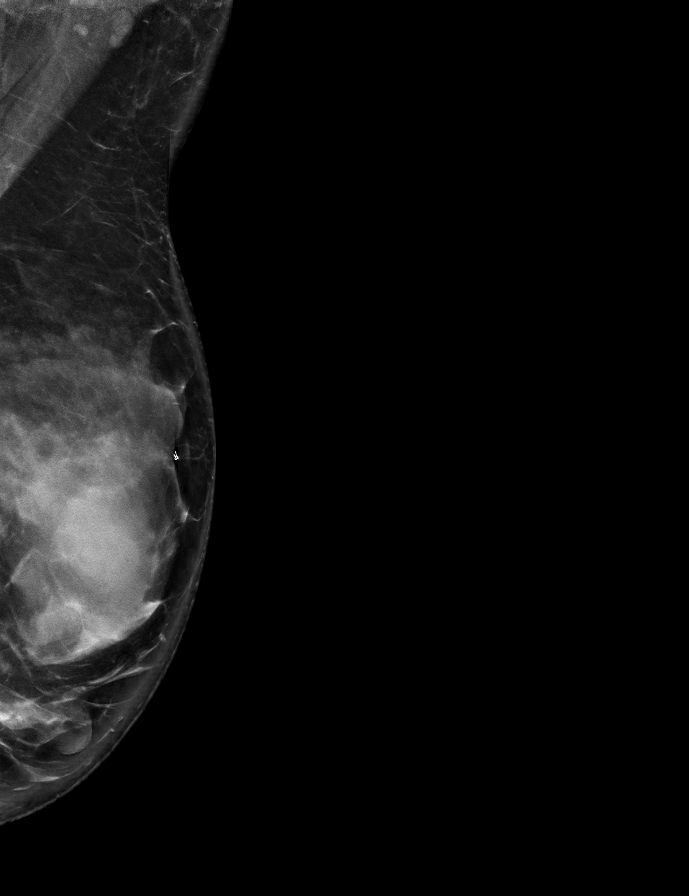

[L MLO synth-2D (3 of 3)]
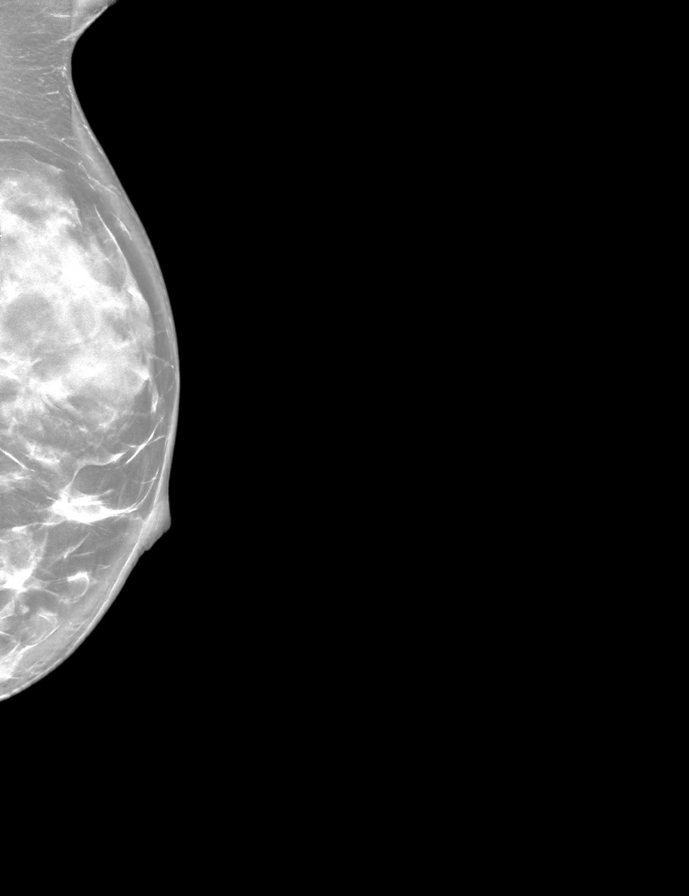

[R MLO tomo · tomo slice 33/66.0]
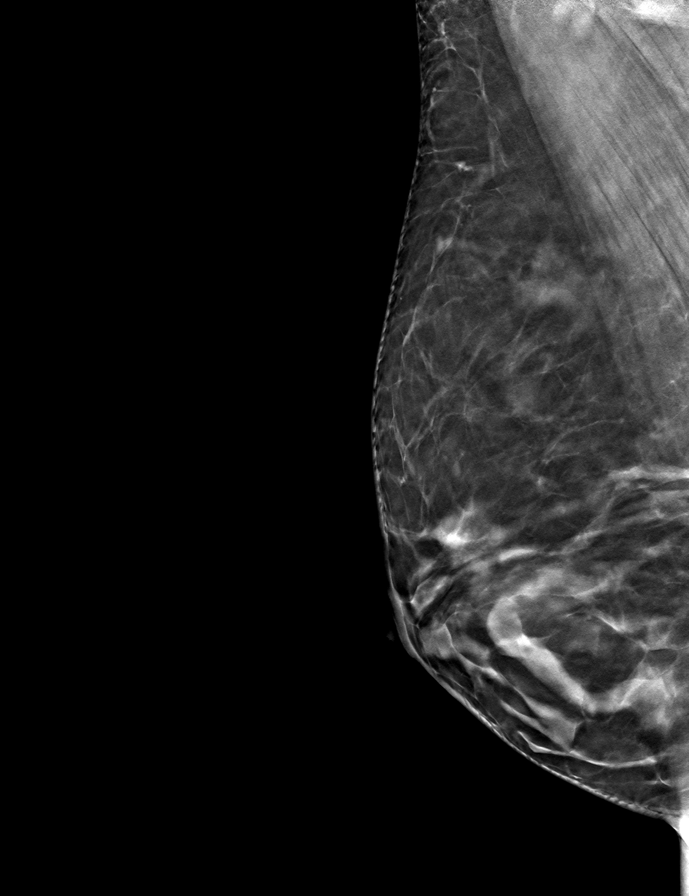

[9 of 40 positions shown; findings below may reference images not displayed]

ACR Breast Density Category c: The breast tissue is heterogeneously
dense, which may obscure small masses.
FINDINGS: The patient has a left retroglandular implant. There are no findings
suspicious for malignancy.
IMPRESSION: No mammographic evidence of malignancy. A result letter of this
screening mammogram will be mailed directly to the patient.

RECOMMENDATION:
Screening mammogram in one year. (Code:[id number])

BI-RADS CATEGORY  1:  Negative.

## 2022-01-28 ENCOUNTER — Encounter: Payer: Self-pay | Admitting: Primary Care

## 2022-03-19 ENCOUNTER — Other Ambulatory Visit: Payer: Self-pay

## 2022-03-19 DIAGNOSIS — Z1231 Encounter for screening mammogram for malignant neoplasm of breast: Secondary | ICD-10-CM

## 2022-03-26 ENCOUNTER — Ambulatory Visit: Payer: BC Managed Care – PPO

## 2022-06-10 ENCOUNTER — Ambulatory Visit: Payer: Self-pay

## 2022-06-10 ENCOUNTER — Ambulatory Visit: Admission: RE | Admit: 2022-06-10 | Payer: Self-pay | Source: Ambulatory Visit

## 2022-06-28 ENCOUNTER — Other Ambulatory Visit: Payer: Self-pay

## 2022-06-28 DIAGNOSIS — Z1231 Encounter for screening mammogram for malignant neoplasm of breast: Secondary | ICD-10-CM

## 2022-07-01 ENCOUNTER — Ambulatory Visit: Payer: Self-pay

## 2022-09-18 ENCOUNTER — Encounter: Payer: Self-pay | Admitting: Primary Care

## 2022-12-04 IMAGING — US US CAROTID DUPLEX BILAT
1 series · 13 of 24 positions shown · non-contrast
Comparison: None Available.

CLINICAL DATA: 41-year-old female possible giant cell arteritis

EXAM:
BILATERAL CAROTID DUPLEX ULTRASOUND
TECHNIQUE: Gray scale imaging, color Doppler and duplex ultrasound were
performed of bilateral carotid and vertebral arteries in the neck.

[Series 1: us carotid duplex bilat · 0.06mm/px · 13 of 55 slices shown]
[im 1/55]
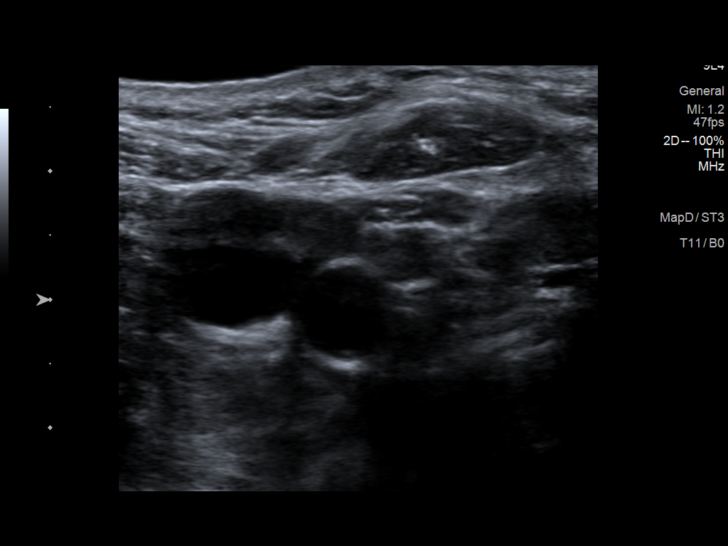
[im 5/55]
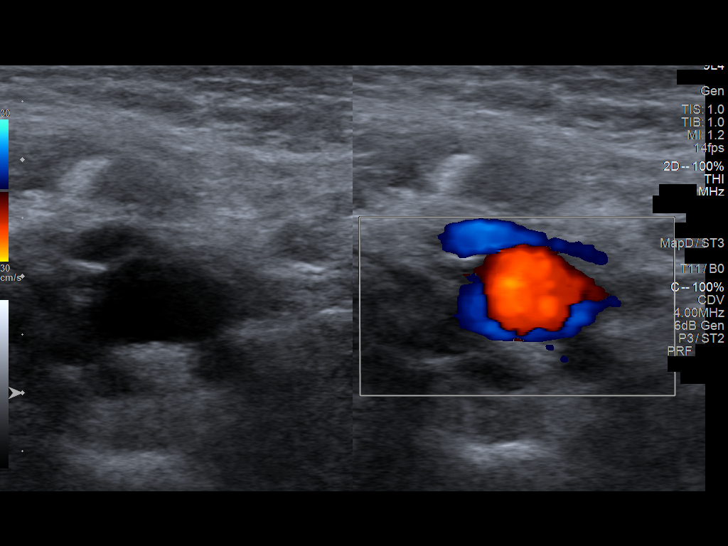
[im 10/55]
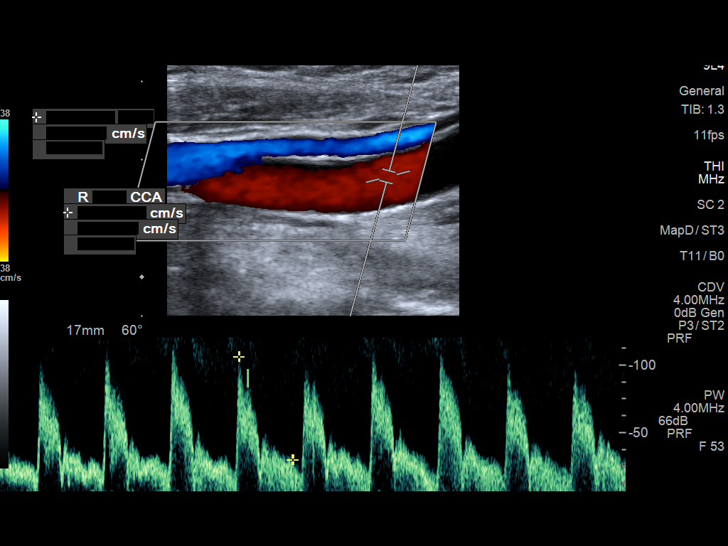
[im 15/55]
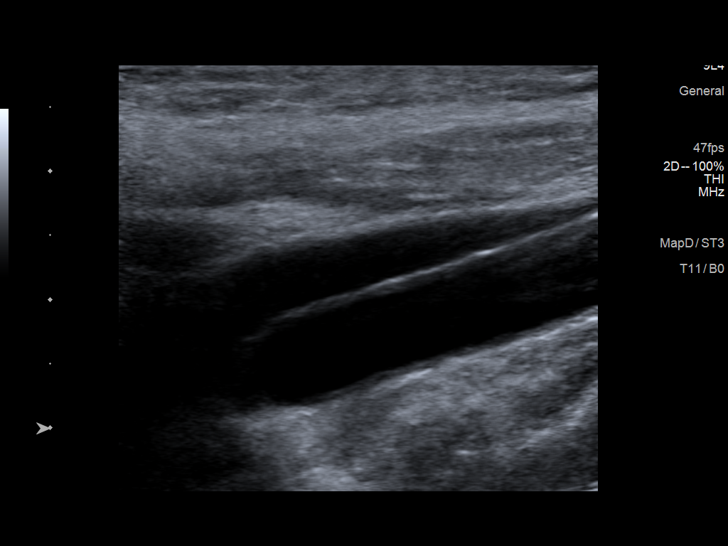
[im 19/55]
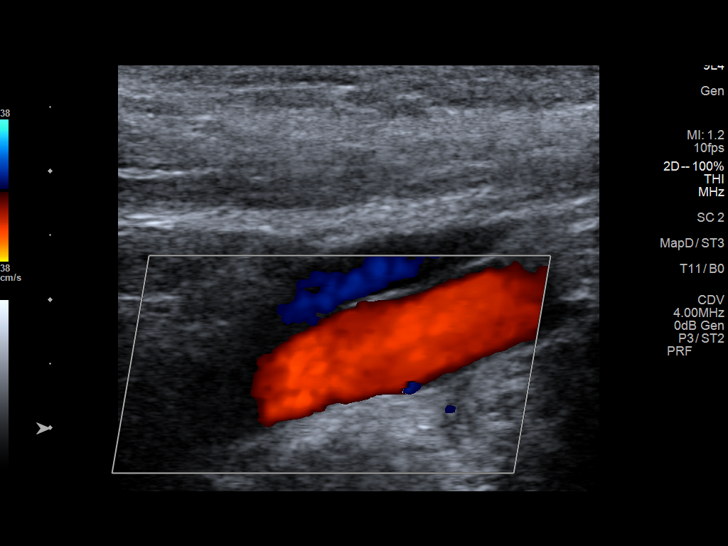
[im 24/55]
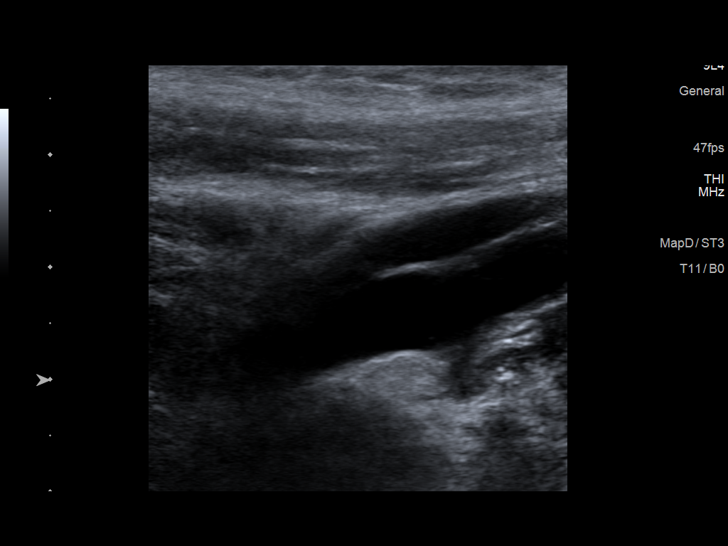
[im 29/55]
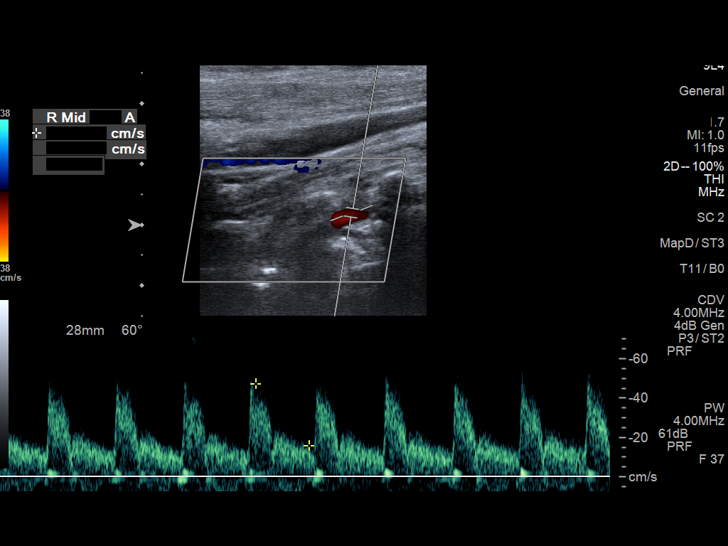
[im 31/55]
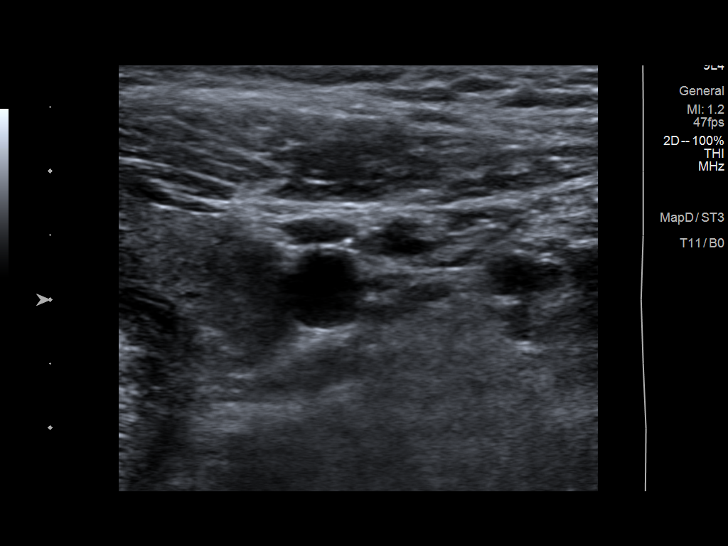
[im 36/55]
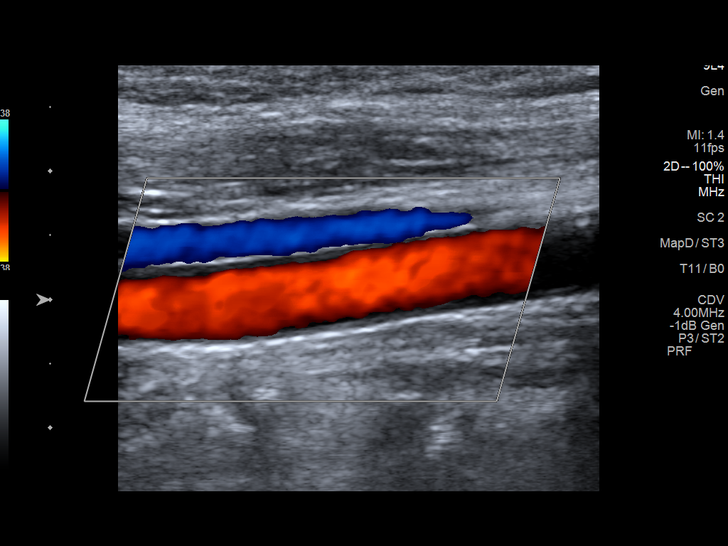
[im 40/55]
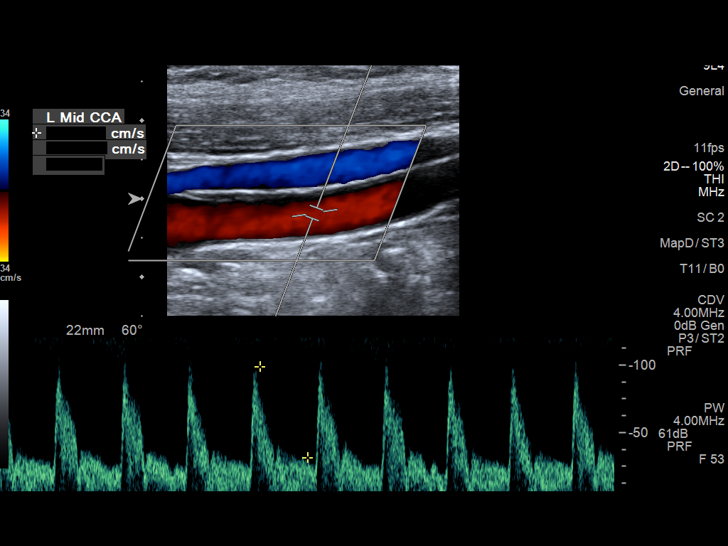
[im 45/55]
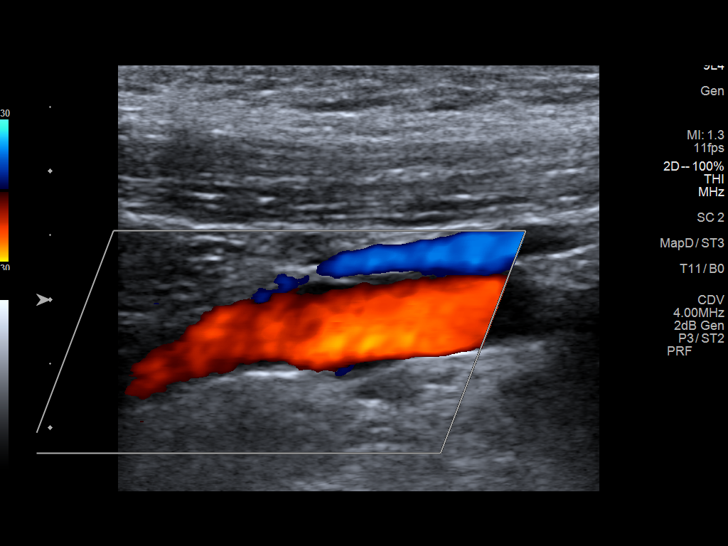
[im 50/55]
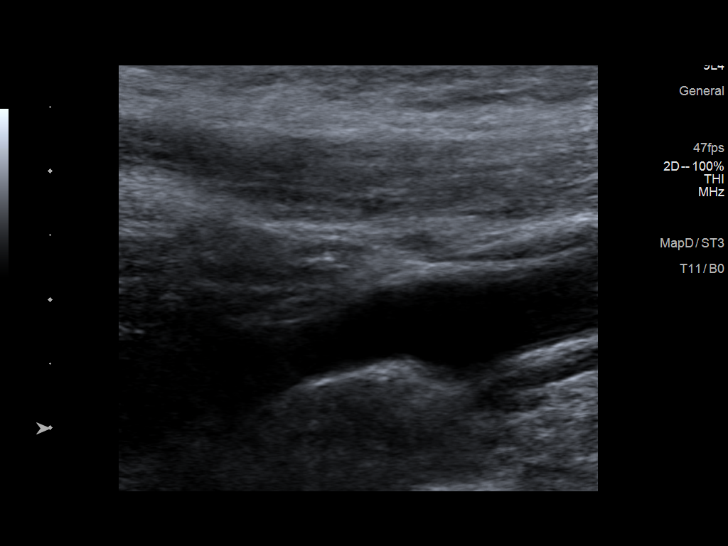
[im 55/55]
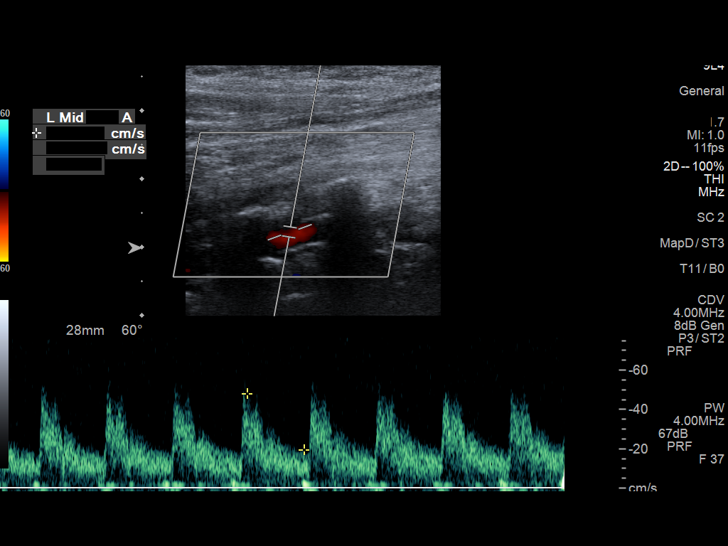

[13 of 24 positions shown; findings below may reference images not displayed]

FINDINGS: Criteria: Quantification of carotid stenosis is based on velocity
parameters that correlate the residual internal carotid diameter
with NASCET-based stenosis levels, using the diameter of the distal
internal carotid lumen as the denominator for stenosis measurement.

The following velocity measurements were obtained:

RIGHT

ICA:  Systolic 59 cm/sec, Diastolic 25 cm/sec

CCA:  95 cm/sec

SYSTOLIC ICA/CCA RATIO:

ECA:  82 cm/sec

LEFT

ICA:  Systolic 74 cm/sec, Diastolic 32 cm/sec

CCA:  99 cm/sec

SYSTOLIC ICA/CCA RATIO:

ECA:  77 cm/sec

Right Brachial SBP: Not acquired

Left Brachial SBP: Not acquired

RIGHT CAROTID ARTERY: Unremarkable appearance of the carotid system
without significant atherosclerotic disease. Intermediate waveform
of the CCA. Low resistance waveform of the ICA. No significant
tortuosity of the carotid system.

RIGHT VERTEBRAL ARTERY: Antegrade flow with low resistance waveform.

LEFT CAROTID ARTERY: Unremarkable appearance of the carotid system
without significant atherosclerotic disease. Intermediate waveform
of the CCA. Low resistance waveform of the ICA. No significant
tortuosity of the carotid system.

LEFT VERTEBRAL ARTERY:  Antegrade flow with low resistance waveform.
IMPRESSION: Color duplex indicates no significant plaque, with no
hemodynamically significant stenosis by duplex criteria in the
extracranial cerebrovascular circulation.

## 2023-04-21 ENCOUNTER — Telehealth: Payer: Self-pay | Admitting: *Deleted

## 2023-04-21 ENCOUNTER — Encounter: Payer: Self-pay | Admitting: Primary Care

## 2023-07-24 ENCOUNTER — Other Ambulatory Visit: Payer: Self-pay

## 2023-07-24 DIAGNOSIS — Z1231 Encounter for screening mammogram for malignant neoplasm of breast: Secondary | ICD-10-CM

## 2023-08-11 ENCOUNTER — Other Ambulatory Visit: Payer: Self-pay

## 2023-08-11 ENCOUNTER — Ambulatory Visit: Payer: Self-pay | Attending: Obstetrics and Gynecology | Admitting: *Deleted

## 2023-08-11 ENCOUNTER — Ambulatory Visit: Admission: RE | Admit: 2023-08-11 | Payer: Self-pay | Source: Ambulatory Visit

## 2023-08-11 VITALS — BP 133/80 | Wt 141.5 lb

## 2023-08-11 DIAGNOSIS — N644 Mastodynia: Secondary | ICD-10-CM

## 2023-08-11 DIAGNOSIS — M79622 Pain in left upper arm: Secondary | ICD-10-CM

## 2023-08-11 DIAGNOSIS — Z1239 Encounter for other screening for malignant neoplasm of breast: Secondary | ICD-10-CM

## 2023-08-11 NOTE — Progress Notes (Unsigned)
 [name] is a 43 y.o. female who presents to [facility] clinic today with {Blank single:19197::"no complaints","complaint of"} ***.    Pap Smear: Pap smear not completed today. Last Pap smear was *** at *** clinic and was {Blank single:19197::"normal","abnormal - ***"}. Per patient has {Blank single:19197::"no history","history"} of an abnormal Pap smear. Last Pap smear result {Blank single:19197::"is available in","is not available in"} Epic.   Physical exam: Breasts Breasts symmetrical. No skin abnormalities bilateral breasts. No nipple retraction bilateral breasts. No nipple discharge bilateral breasts. No lymphadenopathy. No lumps palpated bilateral breasts.      MM 3D SCREEN BREAST W/IMPLANT BILATERAL Result Date: 10/03/2020 CLINICAL DATA:  Screening. EXAM: DIGITAL SCREENING BILATERAL MAMMOGRAM WITH IMPLANTS, CAD AND TOMOSYNTHESIS TECHNIQUE: Bilateral screening digital craniocaudal and mediolateral oblique mammograms were obtained. Bilateral screening digital breast tomosynthesis was performed. The images were evaluated with computer-aided detection. Standard and/or implant displaced views were performed. COMPARISON:  Previous exam(s). ACR Breast Density Category c: The breast tissue is heterogeneously dense, which may obscure small masses. FINDINGS: The patient has a left retroglandular implant. There are no findings suspicious for malignancy. IMPRESSION: No mammographic evidence of malignancy. A result letter of this screening mammogram will be mailed directly to the patient. RECOMMENDATION: Screening mammogram in one year. (Code:[id number]-01Y) BI-RADS CATEGORY  1:  Negative. Electronically Signed   By: Amanda Jungling M.D.   On: 10/03/2020 11:56  MM DIAG BREAST W/IMPLANT TOMO UNI L Result Date: 12/07/2019 CLINICAL DATA:  The patient states that she feels a nickel sized lump in the lateral left breast. She reports that she 1st felt this over a year ago and that it is increasing in size. She  did not report this at that time of her screening mammogram on 07/26/2019. 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 EXAM: DIGITAL DIAGNOSTIC LEFT MAMMOGRAM WITH IMPLANTS, CAD AND TOMO ULTRASOUND LEFT BREAST The patient has a retroglandular implant. Standard and implant displaced views were performed. COMPARISON:  Previous exam(s). ACR Breast Density Category d: The breast tissue is extremely dense, which lowers the sensitivity of mammography. FINDINGS: There is mammographically stable extremely dense tissue in the upper outer left breast at the location of the mass felt by the patient, marked with a metallic marker. There is also a biopsy marker clip in that region of the breast. No mammographically visible mass or other findings suspicious for malignancy. Mammographic images were processed with CAD. On physical exam, the patient described diffuse tenderness throughout the upper-outer left breast. There was no palpable mass today. Targeted ultrasound is performed, showing a 4 mm cyst containing low-level internal echoes in the 1 o'clock position of the left breast, 7 cm from the nipple, at the location of patient concern. Deeper in that portion of the breast, there was a 6 mm simple cyst. No solid masses were seen. IMPRESSION: Benign left breast cysts.  No evidence of malignancy. RECOMMENDATION: Bilateral screening mammogram in 8 months when due. That will be 1 year since mammographic evaluation of the right breast. I have discussed the findings and recommendations with the patient. If applicable, a reminder letter will be sent to the patient regarding the next appointment. BI-RADS CATEGORY  2: Benign. Electronically Signed   By: Catherin Closs M.D.   On: 12/07/2019 16:26   MM 3D SCREEN BREAST W/IMPLANT BILATERAL Result Date: 07/27/2019 CLINICAL DATA:  Screening. EXAM: DIGITAL SCREENING BILATERAL MAMMOGRAM WITH IMPLANTS, CAD AND TOMO The patient has retropectoral  implants. Standard  and implant displaced views were performed. COMPARISON:  Previous exam(s). ACR Breast Density Category c: The breast tissue is heterogeneously dense, which may obscure small masses. FINDINGS: There are no findings suspicious for malignancy. Images were processed with CAD. IMPRESSION: No mammographic evidence of malignancy. A result letter of this screening mammogram will be mailed directly to the patient. RECOMMENDATION: Screening mammogram in one year. (Code:[id number]) BI-RADS CATEGORY  1:  Negative. Electronically Signed   By: Amanda Jungling M.D.   On: 07/27/2019 07:51    Pelvic/Bimanual Pap is not indicated today per BCCCP guidelines.   Smoking History: Patient has never smoked.   Patient Navigation: Patient education provided. Access to services provided for patient through [employer] program. Spanish interpreter Francoise Ishihara from [facility] provided.   Breast and Cervical Cancer Risk Assessment: Patient {Blank single:19197::"has","does not have"} family history of breast cancer, known genetic mutations, or radiation treatment to the chest before age 25. Patient {Blank single:19197::"has","does not have"} history of cervical dysplasia, immunocompromised, or DES exposure in-utero.  Risk Scores as of Encounter on 08/11/2023     Wendy Ellis           5-year 1.08%   Lifetime 10.52%   This patient is [place]/Latina but has no documented birth country, so the [place] model used data from [place] patients to calculate their risk score. Document a birth country in the Demographics activity for a more accurate score.         Last calculated by Algie Ingle, LPN on 04/13/1094 at 11:07 AM        A: BCCCP exam without pap smear Complaint of ***  P: Referred patient to the [facility] for a {Blank single:19197::"screening","diagnostic"} mammogram. Appointment scheduled ***.  Stefan Edge, RN 08/11/2023 11:11 AM

## 2023-08-11 NOTE — Patient Instructions (Signed)
 Explained breast self awareness with [facility]. Patient did not need a Pap smear today due to last Pap smear was in *** per patient. Told patient about free cervical cancer screenings to receive a Pap smear if would like one next year. Let her know BCCCP will cover Pap smears every 3 years unless has a history of abnormal Pap smears. Referred patient to [facility] for diagnostic mammogram. Appointment scheduled for ***, 2014 at ***. Patient aware of appointment and will be there. Let patient know will follow up with her within the next couple weeks with results. [facility] verbalized understanding.  Aaleyah Witherow, Dela Favor, RN 11:11 AM

## 2023-08-15 ENCOUNTER — Encounter: Payer: Self-pay | Admitting: Obstetrics & Gynecology

## 2023-08-15 ENCOUNTER — Ambulatory Visit
Admission: RE | Admit: 2023-08-15 | Discharge: 2023-08-15 | Disposition: A | Discharge status: Home / Self Care | Payer: Self-pay | Source: Ambulatory Visit | Attending: Obstetrics and Gynecology | Admitting: Obstetrics and Gynecology

## 2023-08-15 DIAGNOSIS — M79622 Pain in left upper arm: Secondary | ICD-10-CM

## 2023-08-15 DIAGNOSIS — N644 Mastodynia: Secondary | ICD-10-CM
